# Patient Record
Sex: Female | Born: 1980 | Hispanic: No | Smoking: Never smoker
Health system: Southern US, Community
[De-identification: ages and names within clinical notes are randomized; demographics above are authoritative.]

## PROBLEM LIST (undated history)

## (undated) DIAGNOSIS — N3010 Interstitial cystitis (chronic) without hematuria: Secondary | ICD-10-CM

## (undated) DIAGNOSIS — I2699 Other pulmonary embolism without acute cor pulmonale: Secondary | ICD-10-CM

## (undated) DIAGNOSIS — D619 Aplastic anemia, unspecified: Secondary | ICD-10-CM

## (undated) DIAGNOSIS — D6851 Activated protein C resistance: Secondary | ICD-10-CM

## (undated) DIAGNOSIS — D509 Iron deficiency anemia, unspecified: Secondary | ICD-10-CM

## (undated) DIAGNOSIS — C569 Malignant neoplasm of unspecified ovary: Secondary | ICD-10-CM

## (undated) DIAGNOSIS — M797 Fibromyalgia: Secondary | ICD-10-CM

## (undated) DIAGNOSIS — C73 Malignant neoplasm of thyroid gland: Secondary | ICD-10-CM

## (undated) DIAGNOSIS — C55 Malignant neoplasm of uterus, part unspecified: Secondary | ICD-10-CM

## (undated) DIAGNOSIS — M1990 Unspecified osteoarthritis, unspecified site: Secondary | ICD-10-CM

## (undated) DIAGNOSIS — G8929 Other chronic pain: Secondary | ICD-10-CM

## (undated) DIAGNOSIS — M4850XA Collapsed vertebra, not elsewhere classified, site unspecified, initial encounter for fracture: Secondary | ICD-10-CM

## (undated) DIAGNOSIS — R569 Unspecified convulsions: Secondary | ICD-10-CM

## (undated) DIAGNOSIS — D6859 Other primary thrombophilia: Secondary | ICD-10-CM

## (undated) DIAGNOSIS — C50919 Malignant neoplasm of unspecified site of unspecified female breast: Secondary | ICD-10-CM

## (undated) HISTORY — PX: ABDOMINAL HYSTERECTOMY: SHX81

## (undated) HISTORY — PX: SINUSOTOMY: SHX291

## (undated) HISTORY — PX: THYROIDECTOMY: SHX17

## (undated) HISTORY — PX: GASTRIC BYPASS: SHX52

## (undated) HISTORY — PX: ABDOMINAL EXPLORATION SURGERY: SHX538

## (undated) HISTORY — PX: BREAST LUMPECTOMY: SHX2

## (undated) HISTORY — PX: COMBINED AUGMENTATION MAMMAPLASTY AND ABDOMINOPLASTY: SUR291

## (undated) HISTORY — PX: APPENDECTOMY: SHX54

## (undated) HISTORY — PX: CHOLECYSTECTOMY: SHX55

---

## 2009-10-03 ENCOUNTER — Emergency Department (HOSPITAL_COMMUNITY)
Admission: EM | Admit: 2009-10-03 | Discharge: 2009-10-03 | Payer: Self-pay | Source: Home / Self Care | Admitting: Emergency Medicine

## 2010-08-22 ENCOUNTER — Emergency Department (HOSPITAL_COMMUNITY)
Admission: EM | Admit: 2010-08-22 | Discharge: 2010-08-23 | Payer: Self-pay | Source: Home / Self Care | Admitting: Emergency Medicine

## 2010-08-22 LAB — DIFFERENTIAL
Basophils Relative: 0 % (ref 0–1)
Monocytes Absolute: 1.2 10*3/uL — ABNORMAL HIGH (ref 0.1–1.0)
Monocytes Relative: 11 % (ref 3–12)
Neutro Abs: 8 10*3/uL — ABNORMAL HIGH (ref 1.7–7.7)
Neutrophils Relative %: 71 % (ref 43–77)

## 2010-08-22 LAB — CBC
Hemoglobin: 10.5 g/dL — ABNORMAL LOW (ref 12.0–15.0)
Platelets: 293 10*3/uL (ref 150–400)
RBC: 3.57 MIL/uL — ABNORMAL LOW (ref 3.87–5.11)
RDW: 12.7 % (ref 11.5–15.5)

## 2010-08-22 LAB — BASIC METABOLIC PANEL
CO2: 25 mEq/L (ref 19–32)
Chloride: 105 mEq/L (ref 96–112)
GFR calc Af Amer: >60 mL/min (ref >60)
Glucose, Bld: 98 mg/dL (ref 70–99)
Potassium: 4 mEq/L (ref 3.5–5.1)
Sodium: 138 mEq/L (ref 135–145)

## 2010-08-25 ENCOUNTER — Emergency Department (HOSPITAL_COMMUNITY)
Admission: EM | Admit: 2010-08-25 | Discharge: 2010-08-25 | Payer: Self-pay | Source: Home / Self Care | Admitting: Emergency Medicine

## 2010-08-25 LAB — CBC
HCT: 30.5 % — ABNORMAL LOW (ref 36.0–46.0)
Hemoglobin: 10 g/dL — ABNORMAL LOW (ref 12.0–15.0)
MCH: 28.6 pg (ref 26.0–34.0)
MCHC: 32.8 g/dL (ref 30.0–36.0)
MCV: 87.1 fL (ref 78.0–100.0)

## 2010-08-25 LAB — POCT I-STAT, CHEM 8
BUN: 6 mg/dL (ref 6–23)
Chloride: 103 mEq/L (ref 96–112)
Creatinine, Ser: 0.7 mg/dL (ref 0.4–1.2)
HCT: 32 % — ABNORMAL LOW (ref 36.0–46.0)
Potassium: 3.6 mEq/L (ref 3.5–5.1)
Sodium: 135 mEq/L (ref 135–145)

## 2010-08-25 LAB — COMPREHENSIVE METABOLIC PANEL
AST: 107 U/L — ABNORMAL HIGH (ref 0–37)
Albumin: 2.7 g/dL — ABNORMAL LOW (ref 3.5–5.2)
BUN: 5 mg/dL — ABNORMAL LOW (ref 6–23)
Creatinine, Ser: 0.48 mg/dL (ref 0.4–1.2)
GFR calc non Af Amer: >60 mL/min (ref >60)
Potassium: 3.5 mEq/L (ref 3.5–5.1)
Sodium: 133 mEq/L — ABNORMAL LOW (ref 135–145)

## 2010-08-25 LAB — URINALYSIS, ROUTINE W REFLEX MICROSCOPIC
Ketones, ur: NEGATIVE mg/dL
Nitrite: NEGATIVE
Protein, ur: NEGATIVE mg/dL
Specific Gravity, Urine: 1.007 (ref 1.005–1.030)
Urobilinogen, UA: 0.2 mg/dL (ref 0.0–1.0)
pH: 7.5 (ref 5.0–8.0)

## 2010-08-25 LAB — DIFFERENTIAL
Basophils Absolute: 0 10*3/uL (ref 0.0–0.1)
Eosinophils Absolute: 0.1 10*3/uL (ref 0.0–0.7)
Monocytes Relative: 9 % (ref 3–12)
Neutro Abs: 9.4 10*3/uL — ABNORMAL HIGH (ref 1.7–7.7)
Neutrophils Relative %: 83 % — ABNORMAL HIGH (ref 43–77)

## 2010-08-25 LAB — URINE MICROSCOPIC-ADD ON

## 2010-10-19 LAB — DIFFERENTIAL
Basophils Absolute: 0 10*3/uL (ref 0.0–0.1)
Basophils Relative: 0 % (ref 0–1)
Eosinophils Absolute: 0.2 10*3/uL (ref 0.0–0.7)
Lymphs Abs: 1.3 10*3/uL (ref 0.7–4.0)
Monocytes Relative: 8 % (ref 3–12)

## 2010-10-19 LAB — POCT I-STAT, CHEM 8
BUN: 10 mg/dL (ref 6–23)
Chloride: 112 mEq/L (ref 96–112)
Creatinine, Ser: <0.2 mg/dL — ABNORMAL LOW (ref 0.4–1.2)
Hemoglobin: 11.9 g/dL — ABNORMAL LOW (ref 12.0–15.0)
Potassium: 3.7 mEq/L (ref 3.5–5.1)
TCO2: 14 mmol/L (ref 0–100)

## 2010-10-19 LAB — CBC
MCV: 87.4 fL (ref 78.0–100.0)
WBC: 4.6 10*3/uL (ref 4.0–10.5)

## 2010-10-19 LAB — CARBAMAZEPINE LEVEL, TOTAL: Carbamazepine Lvl: <2 ug/mL — ABNORMAL LOW (ref 4.0–12.0)

## 2012-09-19 ENCOUNTER — Encounter (HOSPITAL_BASED_OUTPATIENT_CLINIC_OR_DEPARTMENT_OTHER): Payer: Self-pay | Admitting: *Deleted

## 2012-09-19 ENCOUNTER — Emergency Department (HOSPITAL_BASED_OUTPATIENT_CLINIC_OR_DEPARTMENT_OTHER)
Admission: EM | Admit: 2012-09-19 | Discharge: 2012-09-19 | Disposition: A | Discharge status: Home / Self Care | Attending: Emergency Medicine | Admitting: Emergency Medicine

## 2012-09-19 ENCOUNTER — Emergency Department (HOSPITAL_BASED_OUTPATIENT_CLINIC_OR_DEPARTMENT_OTHER)

## 2012-09-19 DIAGNOSIS — Z8543 Personal history of malignant neoplasm of ovary: Secondary | ICD-10-CM | POA: Insufficient documentation

## 2012-09-19 DIAGNOSIS — R0989 Other specified symptoms and signs involving the circulatory and respiratory systems: Secondary | ICD-10-CM | POA: Insufficient documentation

## 2012-09-19 DIAGNOSIS — R209 Unspecified disturbances of skin sensation: Secondary | ICD-10-CM | POA: Insufficient documentation

## 2012-09-19 DIAGNOSIS — R0789 Other chest pain: Secondary | ICD-10-CM | POA: Insufficient documentation

## 2012-09-19 DIAGNOSIS — Z79899 Other long term (current) drug therapy: Secondary | ICD-10-CM | POA: Insufficient documentation

## 2012-09-19 DIAGNOSIS — G8929 Other chronic pain: Secondary | ICD-10-CM | POA: Insufficient documentation

## 2012-09-19 DIAGNOSIS — R609 Edema, unspecified: Secondary | ICD-10-CM | POA: Insufficient documentation

## 2012-09-19 DIAGNOSIS — Z862 Personal history of diseases of the blood and blood-forming organs and certain disorders involving the immune mechanism: Secondary | ICD-10-CM | POA: Insufficient documentation

## 2012-09-19 DIAGNOSIS — Z8542 Personal history of malignant neoplasm of other parts of uterus: Secondary | ICD-10-CM | POA: Insufficient documentation

## 2012-09-19 DIAGNOSIS — Z853 Personal history of malignant neoplasm of breast: Secondary | ICD-10-CM | POA: Insufficient documentation

## 2012-09-19 DIAGNOSIS — Z8739 Personal history of other diseases of the musculoskeletal system and connective tissue: Secondary | ICD-10-CM | POA: Insufficient documentation

## 2012-09-19 DIAGNOSIS — Z86711 Personal history of pulmonary embolism: Secondary | ICD-10-CM | POA: Insufficient documentation

## 2012-09-19 DIAGNOSIS — R079 Chest pain, unspecified: Secondary | ICD-10-CM

## 2012-09-19 DIAGNOSIS — Z85850 Personal history of malignant neoplasm of thyroid: Secondary | ICD-10-CM | POA: Insufficient documentation

## 2012-09-19 DIAGNOSIS — R42 Dizziness and giddiness: Secondary | ICD-10-CM | POA: Insufficient documentation

## 2012-09-19 DIAGNOSIS — Z8781 Personal history of (healed) traumatic fracture: Secondary | ICD-10-CM | POA: Insufficient documentation

## 2012-09-19 DIAGNOSIS — R0609 Other forms of dyspnea: Secondary | ICD-10-CM | POA: Insufficient documentation

## 2012-09-19 DIAGNOSIS — Z8742 Personal history of other diseases of the female genital tract: Secondary | ICD-10-CM | POA: Insufficient documentation

## 2012-09-19 HISTORY — DX: Aplastic anemia, unspecified: D61.9

## 2012-09-19 HISTORY — DX: Malignant neoplasm of thyroid gland: C73

## 2012-09-19 HISTORY — DX: Other pulmonary embolism without acute cor pulmonale: I26.99

## 2012-09-19 HISTORY — DX: Malignant neoplasm of uterus, part unspecified: C55

## 2012-09-19 HISTORY — DX: Fibromyalgia: M79.7

## 2012-09-19 HISTORY — DX: Other chronic pain: G89.29

## 2012-09-19 HISTORY — DX: Interstitial cystitis (chronic) without hematuria: N30.10

## 2012-09-19 HISTORY — DX: Unspecified osteoarthritis, unspecified site: M19.90

## 2012-09-19 HISTORY — DX: Collapsed vertebra, not elsewhere classified, site unspecified, initial encounter for fracture: M48.50XA

## 2012-09-19 HISTORY — DX: Malignant neoplasm of unspecified ovary: C56.9

## 2012-09-19 HISTORY — DX: Malignant neoplasm of unspecified site of unspecified female breast: C50.919

## 2012-09-19 LAB — BASIC METABOLIC PANEL
CO2: 26 mEq/L (ref 19–32)
Chloride: 104 mEq/L (ref 96–112)
GFR calc Af Amer: >90 mL/min (ref >90)
Potassium: 3.8 mEq/L (ref 3.5–5.1)
Sodium: 140 mEq/L (ref 135–145)

## 2012-09-19 LAB — CBC WITH DIFFERENTIAL/PLATELET
Basophils Absolute: 0 10*3/uL (ref 0.0–0.1)
Basophils Relative: 0 % (ref 0–1)
Lymphocytes Relative: 43 % (ref 12–46)
MCHC: 32.6 g/dL (ref 30.0–36.0)
Neutro Abs: 2 10*3/uL (ref 1.7–7.7)
Neutrophils Relative %: 41 % — ABNORMAL LOW (ref 43–77)
RDW: 13.1 % (ref 11.5–15.5)
WBC: 4.9 10*3/uL (ref 4.0–10.5)

## 2012-09-19 LAB — D-DIMER, QUANTITATIVE: D-Dimer, Quant: 0.4 ug/mL-FEU (ref 0.00–0.48)

## 2012-09-19 LAB — TROPONIN I: Troponin I: <0.3 ng/mL (ref <0.30)

## 2012-09-19 MED ORDER — CYCLOBENZAPRINE HCL 10 MG PO TABS: 10.0000 mg | ORAL_TABLET | Freq: Three times a day (TID) | ORAL | Status: DC | PRN

## 2012-09-19 NOTE — ED Notes (Signed)
Patient states she developed generalized chest pain 2 nights ago.  Describes pain as constant and sharp.  States this morning the pain is specific on the right chest and is associated with lightheadedness and tingling in her extremities.  States she has a history of PE;'s.

## 2012-09-19 NOTE — ED Provider Notes (Addendum)
History     CSN: [id number]  Arrival date & time 09/19/12  1478   None     Chief Complaint  Patient presents with  . Chest Pain    (Consider location/radiation/quality/duration/timing/severity/associated sxs/prior treatment) HPI Is a 32 year old female with a past history of multiple cancers as well as pulmonary emboli. She is here with a two-day history of chest pain. The pain began as a pressure across her entire chest 2 days ago but has subsequently evolved into a sharp, "ice pick" like pain to the right of her sternum. There is some mild dyspnea associated with it. The pain is not pleuritic. The pain is not similar to previous pulmonary emboli. The pain has waxed and waned and is somewhat episodic. There is no specific trigger for such episodes. She has had periodic lightheadedness and paresthesias of the fingers and toes. Yesterday she had some edema of her ankles and feet but this is resolved. She denies lower extremity pain cough, hemoptysis, fever, chills, nausea, vomiting or diarrhea.  Past Medical History  Diagnosis Date  . Anemia aplastic aregenerative   . Ovarian cancer   . Thyroid cancer   . Breast cancer   . Uterine cancer   . Pulmonary embolism   . Fibromyalgia   . Spinal compression fracture   . IC (interstitial cystitis)   . Chronic pain   . Arthritis     Past Surgical History  Procedure Laterality Date  . Abdominal hysterectomy    . Thyroidectomy    . Appendectomy    . Cholecystectomy    . Gastric bypass    . Breast lumpectomy    . Combined augmentation mammaplasty and abdominoplasty    . Sinusotomy    . Abdominal exploration surgery      No family history on file.  History  Substance Use Topics  . Smoking status: Never Smoker   . Smokeless tobacco: Not on file  . Alcohol Use: Yes     Comment: occassionally    OB History   Grav Para Term Preterm Abortions TAB SAB Ect Mult Living                  Review of Systems  All other systems  reviewed and are negative.    Allergies  Ivp dye  Home Medications   Current Outpatient Rx  Name  Route  Sig  Dispense  Refill  . darifenacin (ENABLEX) 15 MG 24 hr tablet   Oral   Take 15 mg by mouth daily.         . Diclofenac-Misoprostol (ARTHROTEC PO)   Oral   Take by mouth.         . gabapentin (NEURONTIN) 400 MG capsule   Oral   Take 400 mg by mouth 3 (three) times daily.         Marland Kitchen ibuprofen (ADVIL,MOTRIN) 800 MG tablet   Oral   Take 800 mg by mouth every 8 (eight) hours as needed for pain.         Marland Kitchen levothyroxine (SYNTHROID, LEVOTHROID) 100 MCG tablet   Oral   Take 100 mcg by mouth daily.         . Multiple Vitamin (MULTIVITAMIN) capsule   Injection   Inject 1 capsule as directed daily.         Marland Kitchen oxymorphone (OPANA ER) 20 MG 12 hr tablet   Oral   Take 20 mg by mouth every 12 (twelve) hours.         Marland Kitchen  pantoprazole (PROTONIX) 40 MG tablet   Oral   Take 40 mg by mouth daily.         . Vitamin B1-B12 5.5-0.0125 MG/5ML SOLN   Oral   Take by mouth.         . Vitamin D, Ergocalciferol, (DRISDOL) 50000 UNITS CAPS   Oral   Take 50,000 Units by mouth.           BP 131/48  Pulse 86  Temp(Src) 98.2 F (36.8 C) (Oral)  Resp 20  SpO2 100%  Physical Exam General: Well-developed, well-nourished female in no acute distress; appearance consistent with age of record HENT: normocephalic, atraumatic Eyes: pupils equal round and reactive to light; extraocular muscles intact Neck: supple Heart: regular rate and rhythm; no murmurs, rubs or gallops Lungs: clear to auscultation bilaterally Chest: Nontender Abdomen: soft; nondistended; nontender; bowel sounds present Extremities: No deformity; full range of motion; pulses normal; no edema Neurologic: Awake, alert and oriented; motor function intact in all extremities and symmetric; no facial droop Skin: Warm and dry Psychiatric: Normal mood and affect    ED Course  Procedures (including  critical care time)     MDM   Nursing notes and vitals signs, including pulse oximetry, reviewed.  Summary of this visit's results, reviewed by myself:  Labs:  Results for orders placed during the hospital encounter of 09/19/12 (from the past 24 hour(s))  CBC WITH DIFFERENTIAL     Status: Abnormal   Collection Time    09/19/12 11:30 AM      Result Value Range   WBC 4.9  4.0 - 10.5 K/uL   RBC 4.45  3.87 - 5.11 MIL/uL   Hemoglobin 12.6  12.0 - 15.0 g/dL   HCT 91.4  78.2 - 95.6 %   MCV 87.0  78.0 - 100.0 fL   MCH 28.3  26.0 - 34.0 pg   MCHC 32.6  30.0 - 36.0 g/dL   RDW 21.3  08.6 - 57.8 %   Platelets 259  150 - 400 K/uL   Neutrophils Relative 41 (*) 43 - 77 %   Neutro Abs 2.0  1.7 - 7.7 K/uL   Lymphocytes Relative 43  12 - 46 %   Lymphs Abs 2.1  0.7 - 4.0 K/uL   Monocytes Relative 11  3 - 12 %   Monocytes Absolute 0.5  0.1 - 1.0 K/uL   Eosinophils Relative 6 (*) 0 - 5 %   Eosinophils Absolute 0.3  0.0 - 0.7 K/uL   Basophils Relative 0  0 - 1 %   Basophils Absolute 0.0  0.0 - 0.1 K/uL  BASIC METABOLIC PANEL     Status: None   Collection Time    09/19/12 11:30 AM      Result Value Range   Sodium 140  135 - 145 mEq/L   Potassium 3.8  3.5 - 5.1 mEq/L   Chloride 104  96 - 112 mEq/L   CO2 26  19 - 32 mEq/L   Glucose, Bld 92  70 - 99 mg/dL   BUN 13  6 - 23 mg/dL   Creatinine, Ser 4.69  0.50 - 1.10 mg/dL   Calcium 9.1  8.4 - 62.9 mg/dL   GFR calc non Af Amer >90  >90 mL/min   GFR calc Af Amer >90  >90 mL/min  TROPONIN I     Status: None   Collection Time    09/19/12 11:30 AM      Result Value Range  Troponin I <0.30  <0.30 ng/mL  D-DIMER, QUANTITATIVE     Status: None   Collection Time    09/19/12 11:30 AM      Result Value Range   D-Dimer, Quant 0.40  0.00 - 0.48 ug/mL-FEU    Imaging Studies: Dg Chest 2 View  09/19/2012  *RADIOLOGY REPORT*  Clinical Data: Chest pain  CHEST - 2 VIEW  Comparison: August 25, 2010  Findings: The lungs are clear.  The heart size and  pulmonary vascularity normal.  No adenopathy.  No bone lesions.  No pneumothorax.  IMPRESSION: No abnormality noted.   Original Report Authenticated By: Bretta Bang, M.D.        Date: 09/19/2012 10:07 AM  Rate: 86  Rhythm: normal sinus rhythm  QRS Axis: normal  Intervals: normal  ST/T Wave abnormalities: normal  Conduction Disutrbances: none  Narrative Interpretation: unremarkable  Comparison with previous EKG: Rate is slower, incomplete right bundle-branch block no longer evident  1:19 PM Patient's d-dimer is within normal limits. The pain is not pleuritic nor is she hypoxic or tachycardic. At this time pulmonary embolism appears unlikely. 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 The patient was advised should symptoms worsen or change we will consider performing a VQ scan.         Hanley Seamen, MD 09/19/12 1320  Hanley Seamen, MD 09/19/12 1324

## 2013-07-29 ENCOUNTER — Emergency Department (HOSPITAL_COMMUNITY)
Admission: EM | Admit: 2013-07-29 | Discharge: 2013-07-29 | Disposition: A | Discharge status: Home / Self Care | Payer: Medicaid Other | Attending: Emergency Medicine | Admitting: Emergency Medicine

## 2013-07-29 ENCOUNTER — Encounter (HOSPITAL_COMMUNITY): Payer: Self-pay | Admitting: Emergency Medicine

## 2013-07-29 ENCOUNTER — Emergency Department (HOSPITAL_COMMUNITY): Payer: Medicaid Other

## 2013-07-29 DIAGNOSIS — Z8541 Personal history of malignant neoplasm of cervix uteri: Secondary | ICD-10-CM | POA: Insufficient documentation

## 2013-07-29 DIAGNOSIS — G8929 Other chronic pain: Secondary | ICD-10-CM | POA: Insufficient documentation

## 2013-07-29 DIAGNOSIS — IMO0001 Reserved for inherently not codable concepts without codable children: Secondary | ICD-10-CM | POA: Insufficient documentation

## 2013-07-29 DIAGNOSIS — Z853 Personal history of malignant neoplasm of breast: Secondary | ICD-10-CM | POA: Insufficient documentation

## 2013-07-29 DIAGNOSIS — M546 Pain in thoracic spine: Secondary | ICD-10-CM | POA: Insufficient documentation

## 2013-07-29 DIAGNOSIS — M549 Dorsalgia, unspecified: Secondary | ICD-10-CM

## 2013-07-29 DIAGNOSIS — Z888 Allergy status to other drugs, medicaments and biological substances status: Secondary | ICD-10-CM | POA: Insufficient documentation

## 2013-07-29 DIAGNOSIS — Z79899 Other long term (current) drug therapy: Secondary | ICD-10-CM | POA: Insufficient documentation

## 2013-07-29 DIAGNOSIS — Z8543 Personal history of malignant neoplasm of ovary: Secondary | ICD-10-CM | POA: Insufficient documentation

## 2013-07-29 DIAGNOSIS — Z86711 Personal history of pulmonary embolism: Secondary | ICD-10-CM | POA: Insufficient documentation

## 2013-07-29 DIAGNOSIS — Y929 Unspecified place or not applicable: Secondary | ICD-10-CM | POA: Insufficient documentation

## 2013-07-29 DIAGNOSIS — D619 Aplastic anemia, unspecified: Secondary | ICD-10-CM | POA: Insufficient documentation

## 2013-07-29 DIAGNOSIS — M545 Low back pain, unspecified: Secondary | ICD-10-CM | POA: Insufficient documentation

## 2013-07-29 DIAGNOSIS — M129 Arthropathy, unspecified: Secondary | ICD-10-CM | POA: Insufficient documentation

## 2013-07-29 DIAGNOSIS — W108XXA Fall (on) (from) other stairs and steps, initial encounter: Secondary | ICD-10-CM | POA: Insufficient documentation

## 2013-07-29 DIAGNOSIS — Z87440 Personal history of urinary (tract) infections: Secondary | ICD-10-CM | POA: Insufficient documentation

## 2013-07-29 DIAGNOSIS — Z85850 Personal history of malignant neoplasm of thyroid: Secondary | ICD-10-CM | POA: Insufficient documentation

## 2013-07-29 DIAGNOSIS — Y9301 Activity, walking, marching and hiking: Secondary | ICD-10-CM | POA: Insufficient documentation

## 2013-07-29 DIAGNOSIS — R209 Unspecified disturbances of skin sensation: Secondary | ICD-10-CM | POA: Insufficient documentation

## 2013-07-29 DIAGNOSIS — Z8739 Personal history of other diseases of the musculoskeletal system and connective tissue: Secondary | ICD-10-CM | POA: Insufficient documentation

## 2013-07-29 MED ORDER — METHOCARBAMOL 500 MG PO TABS
500.0000 mg | ORAL_TABLET | Freq: Once | ORAL | Status: AC
  Administered 2013-07-29: 500 mg via ORAL
  Filled 2013-07-29: qty 1

## 2013-07-29 MED ORDER — PANTOPRAZOLE SODIUM 40 MG PO TBEC
40.0000 mg | DELAYED_RELEASE_TABLET | Freq: Once | ORAL | Status: AC
  Administered 2013-07-29: 40 mg via ORAL
  Filled 2013-07-29: qty 1

## 2013-07-29 MED ORDER — OXYCODONE-ACETAMINOPHEN 5-325 MG PO TABS
2.0000 | ORAL_TABLET | Freq: Once | ORAL | Status: AC
  Administered 2013-07-29: 2 via ORAL
  Filled 2013-07-29: qty 2

## 2013-07-29 MED ORDER — PANTOPRAZOLE SODIUM 20 MG PO TBEC: 20.0000 mg | DELAYED_RELEASE_TABLET | Freq: Every day | ORAL | Status: DC

## 2013-07-29 MED ORDER — METHOCARBAMOL 500 MG PO TABS: 500.0000 mg | ORAL_TABLET | Freq: Two times a day (BID) | ORAL | Status: DC

## 2013-07-29 MED ORDER — OXYCODONE-ACETAMINOPHEN 5-325 MG PO TABS: 1.0000 | ORAL_TABLET | Freq: Four times a day (QID) | ORAL | Status: DC | PRN

## 2013-07-29 NOTE — ED Notes (Signed)
Signature pad not working. 

## 2013-07-29 NOTE — ED Notes (Signed)
Pt reports she was having back pain yesterday and she had a friend help her pop her back. When they did she felt a "pop" in her back and pain became worse. States shes had severe pain since especially when walking or turning her neck. Her pain is in her neck and lower back. Ambulatory, mae

## 2013-07-29 NOTE — Discharge Instructions (Signed)
°Emergency Department Resource Guide °1) Find a Doctor and Pay Out of Pocket °Although you won't have to find out who is covered by your insurance plan, it is a good idea to ask around and get recommendations. You will then need to call the office and see if the doctor you have chosen will accept you as a new patient and what types of options they offer for patients who are self-pay. Some doctors offer discounts or will set up payment plans for their patients who do not have insurance, but you will need to ask so you aren't surprised when you get to your appointment. ° °2) Contact Your Local Health Department °Not all health departments have doctors that can see patients for sick visits, but many do, so it is worth a call to see if yours does. If you don't know where your local health department is, you can check in your phone book. The CDC also has a tool to help you locate your state's health department, and many state websites also have listings of all of their local health departments. ° °3) Find a Walk-in Clinic °If your illness is not likely to be very severe or complicated, you may want to try a walk in clinic. These are popping up all over the country in pharmacies, drugstores, and shopping centers. They're usually staffed by nurse practitioners or physician assistants that have been trained to treat common illnesses and complaints. They're usually fairly quick and inexpensive. However, if you have serious medical issues or chronic medical problems, these are probably not your best option. ° °No Primary Care Doctor: °- Call [facility] at  832-8000 - they can help you locate a primary care doctor that  accepts your insurance, provides certain services, etc. °- Physician Referral Service- 1-800-533-3463 ° °Chronic Pain Problems: °Organization         Address  Phone   Notes  °[facility]  (336) [phone] Patients need to be referred by their primary care doctor.  ° °Medication  Assistance: °Organization         Address  Phone   Notes  °[place] [facility] [address] °[place], [place] [address] (336) 641-8030 --Must be a resident of [place] °-- Must have NO insurance coverage whatsoever (no Medicaid/ Medicare, etc.) °-- The pt. MUST have a primary care doctor that directs their care regularly and follows them in the community °  °[facility]  (866) 331-1348   °[employer]  (888) 892-1162   ° °Agencies that provide inexpensive medical care: °Organization         Address  Phone   Notes  °[facility]  (336) 832-8035   °[facility]    (336) 832-7272   °[facility] [address] °[place], [place] [address] (336) 832-4777   °[facility] [address], °[place] (336) 271-4999   °[facility]    (336) 373-0678   °[facility]    (336) 272-1050   °[facility] ° [address], [place] Phone:  (336) 832-4444, Fax:  (336) 832-4440 Hours of Operation:  9 am - 6 pm, M-F.  Also accepts Medicaid/Medicare and self-pay.  ° [facility] ° [address], [place] Phone: (336) [phone], Fax: (336) [phone]. Hours of Operation:  8:30 am - 5:30 pm, M-F.  Also accepts Medicaid and self-pay.  °[facility] [place] [address], [place] Phone: (336) 878-6027   °[facility] [address], [place], [place] (336)723-1848, Ext. 123 Mondays & Thursdays: 7-9 AM.  First 15 patients are seen on a first come, first serve basis. °  ° °Medicaid-accepting [place] Providers: ° °Organization         Address  Phone   Notes  °[facility] [address], [place] (336) 641-2100 Also accepts self-pay patients.  °[facility] [address], [place] ° (336) 856-9996   °[facility] [address], [place]  (336) 288-8857   °[facility] [address] [place] [address], [place] (336) 299-7000   °Veita Bland [address], [place]  ° (336) 373-1557 Only accepts [place] Access Medicaid patients after they have their name applied to their card.  ° °Self-Pay (no insurance) in [place]: ° °Organization         Address  Phone   Notes  °Sickle Cell Patients, [facility] [address], [place] (336) 832-1970   °[facility] [address], [place] (336) 832-4400   °[facility] ° [address], [place] (336) 992-4800   °[facility]/Dr. Osei-Bonsu ° [address] [place] [address], [place] or [address], [place] (336) [phone] Phone number for both [place] and [place] locations is the same.  °[facility] [address], [place] (336) 299-0000   °[facility] [address] [place] [address], [place] or [address] (336) 852-7530 °(336) 878-2260   °[facility] [address], [place] (336) 350-1642, phone; (336) 294-5005, fax Sees patients 1st and 3rd Saturday of every month.  Must not qualify for public or private insurance (i.e. Medicaid, Medicare, [place] Health Choice, Veterans' Benefits) • Household income should be no more than 200% of the poverty level •The clinic cannot treat you if you are pregnant or think you are pregnant • Sexually transmitted diseases are not treated at the clinic.  ° ° °Dental Care: °Organization         Address  Phone  Notes  °[place] [facility] [address], [place] (336) 641-6152 Accepts children up to age 21 who are enrolled in Medicaid or [place] Health Choice; pregnant women with a Medicaid card; and children who have applied for Medicaid or [place] Health Choice, but were declined, whose parents can pay a reduced fee at time of service.  °[place]  [facility] [place]  [address], [place] (336) 641-7733 Accepts children up to age 21 who are enrolled in Medicaid or [place] Health Choice; pregnant women with a Medicaid card; and children who have applied for Medicaid or [place] Health Choice, but were declined, whose parents can pay a reduced fee at time of service.  °[facility] ° [address],  (336) 641-4533 Patients are seen by appointment only. Walk-ins are not accepted. [facility] will see patients 18 years of age and older. °Monday - Tuesday (8am-5pm) °Most Wednesdays (8:30-5pm) °$30 per visit, cash only  °[facility] ° [address], [place] (336) 641-4533 Patients are seen by appointment only. Walk-ins are not accepted. [facility] will see patients 18 years of age and older. °One   Wednesday Evening (Monthly: Volunteer Based).  $30 per visit, cash only  °[facility]  (919) 537-3737 for adults; Children under age 4, call Graduate Pediatric Dentistry at ([phone]) [phone]. Children aged 4-14, please call (919) [phone] to request a pediatric application. ° Dental services are provided in all areas of dental care including fillings, crowns and bridges, complete and partial dentures, implants, gum treatment, root canals, and extractions. Preventive care is also provided. Treatment is provided to both adults and children. °Patients are selected via a lottery and there is often a waiting list. °  °[facility] [address], °[place] ° (336) 763-8833 [email or web address] °  °[facility] [address], [place], [place] (336)723-1848, Ext. 123 Second and Fourth Thursday of each month, opens at 6:30 AM; Clinic ends at 9 AM.  Patients are seen on a first-come first-served basis, and a limited number are seen during each clinic.  ° °[facility] ° [address], [place], [place] (336) 723-7904    Eligibility Requirements °You must have lived in [place], [place], or [place] counties for at least the last three months. °  You cannot be eligible for state or federal sponsored healthcare insurance, including Veterans Administration, Medicaid, or Medicare. °  You generally cannot be eligible for healthcare insurance through your employer.  °  How to apply: °Eligibility screenings are held every Tuesday and Wednesday afternoon from 1:00 pm until 4:00 pm. You do not need an appointment for the interview!  °[facility] [address], [place], [place] 336-631-2330   °[facility]  336-342-8273   °[facility]  336-703-3100   °[facility]  336-570-6415   ° °Behavioral Health Resources in the Community: °Intensive Outpatient Programs °Organization         Address  Phone  Notes  °[facility] [address], [place], [place] 336-878-6098   °[facility] [address], [place], [place] 336-832-9800   °[facility] [address], [place], [place] ° 336-882-2125   °[facility] [address],  °[place], [place] 1-800-853-5163 or 336-641-4981   °Substance Abuse Resources °Organization         Address  Phone  Notes  °[facility]  336-882-2125   °[facility]  336-784-9470   °[facility]  336-285-9073   °[facility]  336-845-3988   °Residential & Outpatient Substance Abuse Program  1-800-659-3381   °Psychological Services °Organization         Address  Phone  Notes  °[facility]  336- 832-9600   °[facility]  336- 378-7881   °[facility] [address], [place] 1-800-853-5163 or 336-641-4981   ° °Mobile Crisis Teams °Organization         Address  Phone  Notes  °[facility], Mobile Crisis Care Unit  1-877-626-1772   °Assertive °[facility] ° [address]  [place], [place] 336-834-9664   °Sharon DeEsch [address] °[place] 336-554-5454   ° °Self-Help/Support Groups °Organization         Address  Phone             Notes  °[facility]  - variety of support groups  336- 373-1402 Call for more information  °Narcotics Anonymous (NA), [facility] [address], °[place]   2 meetings at this location  ° °  Residential Treatment Programs °Organization         Address  Phone  Notes  °[facility] [address],    °[place]  1-866-801-8205   °[facility] ° [address], [place], [place] 704-293-8524   °[facility] [address], [place] 336-845-3988 Admissions: 8am-3pm M-F  °[facility] [address]    °[place], [place] 336-841-1104   °[facility] [address], [place], [place] 336-379-7146   °[facility] [address]  °[place], [place] 336-285-9073   °[facility] - Intensive Outpatient [address], [place], [place] 336-852-3033   °[facility] [address]  °[place], [place] 1-877-615-2722 or 336-784-9470   °[facility] [address] [place], [place] 336-227-7417 Accepts Medicaid  °[facility] [address]  ° [place] 1-800-659-3381 Substance Abuse/Addiction Treatment  ° °[place] Behavioral Health Resources °Organization         Address  Phone  Notes  °[facility]  (888) 581-9988   °Julie Brannon, PhD [address] [place], [place]   (336) 349-5553 or (336) 951-0000   °[facility]   [address] °[place], [place] (336) 349-4454   °[facility] [address], [place], [place] (336) 342-8316 Insurance/Medicaid/sponsorship through [facility]  °[facility] [address]                                    [place], [place] (336) 342-8316 Therapy/tele-psych/case    °[facility] [address]  ° [place],  (336) 349-2233    °Dr. Arfeen  (336) 349-4544   °[facility] [place]  [employer] [place] [facility] 1) [address],  °2) [address], [place] °3)  [address], [place] (336) 349-3220 °(336) 342-7768 ° °(336) 342-8140   °[place] Child Abuse [facility] (336) 342-1394 or (336) 342-3537 (After Hours)    ° ° °
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
## 2013-07-29 NOTE — ED Notes (Signed)
Patient transported to X-ray 

## 2013-07-30 NOTE — ED Provider Notes (Signed)
Medical screening examination/treatment/procedure(s) were performed by non-physician practitioner and as supervising physician I was immediately available for consultation/collaboration.  EKG Interpretation   None         Rafiq Bucklin, MD 07/30/13 1347 

## 2013-12-28 ENCOUNTER — Encounter: Payer: Self-pay | Admitting: Physical Medicine & Rehabilitation

## 2014-02-08 ENCOUNTER — Ambulatory Visit: Admitting: Physical Medicine & Rehabilitation

## 2014-02-14 ENCOUNTER — Encounter: Payer: Self-pay | Admitting: Physical Medicine & Rehabilitation

## 2014-02-14 ENCOUNTER — Ambulatory Visit (HOSPITAL_BASED_OUTPATIENT_CLINIC_OR_DEPARTMENT_OTHER): Payer: Medicaid Other | Admitting: Physical Medicine & Rehabilitation

## 2014-02-14 ENCOUNTER — Encounter: Attending: Physical Medicine & Rehabilitation

## 2014-02-14 VITALS — BP 142/78 | HR 99 | Resp 14 | Ht 66.0 in | Wt 162.0 lb

## 2014-02-14 DIAGNOSIS — G500 Trigeminal neuralgia: Secondary | ICD-10-CM | POA: Insufficient documentation

## 2014-02-14 DIAGNOSIS — M549 Dorsalgia, unspecified: Secondary | ICD-10-CM

## 2014-02-14 DIAGNOSIS — M542 Cervicalgia: Secondary | ICD-10-CM | POA: Insufficient documentation

## 2014-02-14 DIAGNOSIS — Z9884 Bariatric surgery status: Secondary | ICD-10-CM | POA: Diagnosis not present

## 2014-02-14 DIAGNOSIS — Z853 Personal history of malignant neoplasm of breast: Secondary | ICD-10-CM | POA: Diagnosis not present

## 2014-02-14 DIAGNOSIS — G8921 Chronic pain due to trauma: Secondary | ICD-10-CM | POA: Insufficient documentation

## 2014-02-14 DIAGNOSIS — Z5181 Encounter for therapeutic drug level monitoring: Secondary | ICD-10-CM

## 2014-02-14 DIAGNOSIS — Z8543 Personal history of malignant neoplasm of ovary: Secondary | ICD-10-CM | POA: Diagnosis not present

## 2014-02-14 DIAGNOSIS — Z86711 Personal history of pulmonary embolism: Secondary | ICD-10-CM | POA: Diagnosis not present

## 2014-02-14 DIAGNOSIS — IMO0001 Reserved for inherently not codable concepts without codable children: Secondary | ICD-10-CM | POA: Diagnosis not present

## 2014-02-14 DIAGNOSIS — G8929 Other chronic pain: Secondary | ICD-10-CM | POA: Insufficient documentation

## 2014-02-14 DIAGNOSIS — M797 Fibromyalgia: Secondary | ICD-10-CM

## 2014-02-14 DIAGNOSIS — Z79899 Other long term (current) drug therapy: Secondary | ICD-10-CM

## 2014-02-14 DIAGNOSIS — R209 Unspecified disturbances of skin sensation: Secondary | ICD-10-CM | POA: Insufficient documentation

## 2014-02-14 MED ORDER — GABAPENTIN 600 MG PO TABS: 600.0000 mg | ORAL_TABLET | Freq: Three times a day (TID) | ORAL | Status: DC

## 2014-02-14 MED ORDER — TRAMADOL HCL 50 MG PO TABS: 50.0000 mg | ORAL_TABLET | Freq: Four times a day (QID) | ORAL | Status: DC | PRN

## 2014-02-14 NOTE — Patient Instructions (Signed)
Continue exercising  Acupuncture [facility] in [place]
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
## 2014-02-28 ENCOUNTER — Telehealth: Payer: Self-pay | Admitting: *Deleted

## 2014-02-28 NOTE — Telephone Encounter (Signed)
Attempted to contact patient for the 2nd time. No answer.

## 2014-02-28 NOTE — Telephone Encounter (Signed)
Tramadol is not working and needs something else.  I attempted to reach Ms Eriksson. I left her a message to call the office back.  (Dr Letta Pate is out of office and her UDS was positive for marijuana so he will not prescribe stronger medication based on this result)

## 2014-02-28 NOTE — Telephone Encounter (Signed)
Patient returned call to clinic

## 2014-03-01 NOTE — Telephone Encounter (Signed)
Contacted patient to inform her based on her UDS being positive for marijuana Dr. Letta Pate will not prescribe a stronger medication than Tramadol. Patient verbalized understanding.

## 2014-03-28 ENCOUNTER — Encounter: Attending: Physical Medicine & Rehabilitation

## 2014-03-28 ENCOUNTER — Ambulatory Visit: Admitting: Physical Medicine & Rehabilitation

## 2014-03-28 DIAGNOSIS — G500 Trigeminal neuralgia: Secondary | ICD-10-CM | POA: Insufficient documentation

## 2014-03-28 DIAGNOSIS — G8921 Chronic pain due to trauma: Secondary | ICD-10-CM | POA: Insufficient documentation

## 2014-03-28 DIAGNOSIS — M549 Dorsalgia, unspecified: Secondary | ICD-10-CM | POA: Insufficient documentation

## 2014-03-28 DIAGNOSIS — Z853 Personal history of malignant neoplasm of breast: Secondary | ICD-10-CM | POA: Insufficient documentation

## 2014-03-28 DIAGNOSIS — M542 Cervicalgia: Secondary | ICD-10-CM | POA: Insufficient documentation

## 2014-03-28 DIAGNOSIS — IMO0001 Reserved for inherently not codable concepts without codable children: Secondary | ICD-10-CM | POA: Insufficient documentation

## 2014-03-28 DIAGNOSIS — R209 Unspecified disturbances of skin sensation: Secondary | ICD-10-CM | POA: Insufficient documentation

## 2014-03-28 DIAGNOSIS — Z9884 Bariatric surgery status: Secondary | ICD-10-CM | POA: Insufficient documentation

## 2014-03-28 DIAGNOSIS — Z86711 Personal history of pulmonary embolism: Secondary | ICD-10-CM | POA: Insufficient documentation

## 2014-03-28 DIAGNOSIS — Z8543 Personal history of malignant neoplasm of ovary: Secondary | ICD-10-CM | POA: Insufficient documentation

## 2014-05-09 ENCOUNTER — Other Ambulatory Visit: Payer: Self-pay | Admitting: Physical Medicine & Rehabilitation

## 2014-05-10 ENCOUNTER — Other Ambulatory Visit: Payer: Self-pay | Admitting: Physical Medicine & Rehabilitation

## 2014-05-10 NOTE — Telephone Encounter (Signed)
There is a request for this tramadol but no return appt.  I refilled the gabapentin one time with note that no further refills without appt.  Do you want to refill the tramadol?  She did not return in 6 weeks after last appt and has no appt scheduled.

## 2014-05-10 NOTE — Telephone Encounter (Signed)
A. refill tramadol 1 by mouth twice a day #60, with 3 refills needs appt in Jan

## 2014-05-10 NOTE — Telephone Encounter (Signed)
Called to pharmacy and noted on call to pharmacist about return appt needed.

## 2015-01-18 ENCOUNTER — Encounter (HOSPITAL_COMMUNITY)
Admission: EM | Disposition: A | Discharge status: Home / Self Care | Payer: Self-pay | Source: Home / Self Care | Attending: Emergency Medicine

## 2015-01-18 ENCOUNTER — Emergency Department (HOSPITAL_COMMUNITY): Payer: Medicaid Other | Admitting: Anesthesiology

## 2015-01-18 ENCOUNTER — Other Ambulatory Visit: Payer: Self-pay | Admitting: Physical Medicine & Rehabilitation

## 2015-01-18 ENCOUNTER — Encounter (HOSPITAL_BASED_OUTPATIENT_CLINIC_OR_DEPARTMENT_OTHER): Payer: Self-pay | Admitting: Emergency Medicine

## 2015-01-18 ENCOUNTER — Emergency Department (HOSPITAL_BASED_OUTPATIENT_CLINIC_OR_DEPARTMENT_OTHER): Payer: Medicaid Other

## 2015-01-18 ENCOUNTER — Ambulatory Visit (HOSPITAL_BASED_OUTPATIENT_CLINIC_OR_DEPARTMENT_OTHER)
Admission: EM | Admit: 2015-01-18 | Discharge: 2015-01-19 | Disposition: A | Discharge status: Home / Self Care | Payer: Medicaid Other | Attending: Emergency Medicine | Admitting: Emergency Medicine

## 2015-01-18 DIAGNOSIS — Z91041 Radiographic dye allergy status: Secondary | ICD-10-CM | POA: Insufficient documentation

## 2015-01-18 DIAGNOSIS — Z888 Allergy status to other drugs, medicaments and biological substances status: Secondary | ICD-10-CM | POA: Insufficient documentation

## 2015-01-18 DIAGNOSIS — Z86711 Personal history of pulmonary embolism: Secondary | ICD-10-CM | POA: Insufficient documentation

## 2015-01-18 DIAGNOSIS — D6109 Other constitutional aplastic anemia: Secondary | ICD-10-CM | POA: Insufficient documentation

## 2015-01-18 DIAGNOSIS — Z8543 Personal history of malignant neoplasm of ovary: Secondary | ICD-10-CM | POA: Diagnosis not present

## 2015-01-18 DIAGNOSIS — G8918 Other acute postprocedural pain: Secondary | ICD-10-CM | POA: Insufficient documentation

## 2015-01-18 DIAGNOSIS — Z8542 Personal history of malignant neoplasm of other parts of uterus: Secondary | ICD-10-CM | POA: Insufficient documentation

## 2015-01-18 DIAGNOSIS — W312XXA Contact with powered woodworking and forming machines, initial encounter: Secondary | ICD-10-CM | POA: Diagnosis not present

## 2015-01-18 DIAGNOSIS — Z853 Personal history of malignant neoplasm of breast: Secondary | ICD-10-CM | POA: Diagnosis not present

## 2015-01-18 DIAGNOSIS — G8929 Other chronic pain: Secondary | ICD-10-CM | POA: Insufficient documentation

## 2015-01-18 DIAGNOSIS — S61313A Laceration without foreign body of left middle finger with damage to nail, initial encounter: Secondary | ICD-10-CM | POA: Diagnosis not present

## 2015-01-18 DIAGNOSIS — M797 Fibromyalgia: Secondary | ICD-10-CM | POA: Insufficient documentation

## 2015-01-18 DIAGNOSIS — S61219A Laceration without foreign body of unspecified finger without damage to nail, initial encounter: Secondary | ICD-10-CM

## 2015-01-18 DIAGNOSIS — Z811 Family history of alcohol abuse and dependence: Secondary | ICD-10-CM | POA: Diagnosis not present

## 2015-01-18 DIAGNOSIS — S62633B Displaced fracture of distal phalanx of left middle finger, initial encounter for open fracture: Secondary | ICD-10-CM | POA: Insufficient documentation

## 2015-01-18 DIAGNOSIS — S62639B Displaced fracture of distal phalanx of unspecified finger, initial encounter for open fracture: Secondary | ICD-10-CM

## 2015-01-18 DIAGNOSIS — Z91013 Allergy to seafood: Secondary | ICD-10-CM | POA: Insufficient documentation

## 2015-01-18 DIAGNOSIS — Z85850 Personal history of malignant neoplasm of thyroid: Secondary | ICD-10-CM | POA: Insufficient documentation

## 2015-01-18 HISTORY — PX: I & D EXTREMITY: SHX5045

## 2015-01-18 HISTORY — DX: Unspecified convulsions: R56.9

## 2015-01-18 SURGERY — IRRIGATION AND DEBRIDEMENT EXTREMITY
Anesthesia: General | Site: Finger | Laterality: Left

## 2015-01-18 MED ORDER — MIDAZOLAM HCL 2 MG/2ML IJ SOLN
INTRAMUSCULAR | Status: AC
  Filled 2015-01-18: qty 2

## 2015-01-18 MED ORDER — HYDROMORPHONE HCL 1 MG/ML IJ SOLN: 0.2500 mg | INTRAMUSCULAR | Status: DC | PRN

## 2015-01-18 MED ORDER — FENTANYL CITRATE (PF) 250 MCG/5ML IJ SOLN
INTRAMUSCULAR | Status: DC | PRN
  Administered 2015-01-18 (×2): 50 ug via INTRAVENOUS

## 2015-01-18 MED ORDER — ONDANSETRON HCL 4 MG/2ML IJ SOLN
INTRAMUSCULAR | Status: DC | PRN
  Administered 2015-01-18: 4 mg via INTRAVENOUS

## 2015-01-18 MED ORDER — FENTANYL CITRATE (PF) 250 MCG/5ML IJ SOLN
INTRAMUSCULAR | Status: AC
  Filled 2015-01-18: qty 5

## 2015-01-18 MED ORDER — SULFAMETHOXAZOLE-TRIMETHOPRIM 800-160 MG PO TABS: 1.0000 | ORAL_TABLET | Freq: Two times a day (BID) | ORAL | Status: DC

## 2015-01-18 MED ORDER — PROPOFOL 10 MG/ML IV BOLUS
INTRAVENOUS | Status: AC
  Filled 2015-01-18: qty 20

## 2015-01-18 MED ORDER — CEFAZOLIN SODIUM-DEXTROSE 2-3 GM-% IV SOLR
INTRAVENOUS | Status: DC | PRN
  Administered 2015-01-18: 2 g via INTRAVENOUS

## 2015-01-18 MED ORDER — SODIUM CHLORIDE 0.9 % IR SOLN
Status: DC | PRN
  Administered 2015-01-18: 1000 mL

## 2015-01-18 MED ORDER — HYDROMORPHONE HCL 1 MG/ML IJ SOLN
1.0000 mg | Freq: Once | INTRAMUSCULAR | Status: AC
  Administered 2015-01-18: 1 mg via INTRAMUSCULAR
  Filled 2015-01-18: qty 1

## 2015-01-18 MED ORDER — HYDROCODONE-ACETAMINOPHEN 5-325 MG PO TABS
2.0000 | ORAL_TABLET | Freq: Once | ORAL | Status: AC
  Administered 2015-01-18: 2 via ORAL
  Filled 2015-01-18: qty 2

## 2015-01-18 MED ORDER — LIDOCAINE HCL (PF) 1 % IJ SOLN
INTRAMUSCULAR | Status: AC
  Administered 2015-01-18: 14:00:00
  Filled 2015-01-18: qty 5

## 2015-01-18 MED ORDER — BUPIVACAINE HCL (PF) 0.25 % IJ SOLN
INTRAMUSCULAR | Status: DC | PRN
  Administered 2015-01-18: 10 mL

## 2015-01-18 MED ORDER — PROMETHAZINE HCL 25 MG/ML IJ SOLN: 6.2500 mg | INTRAMUSCULAR | Status: DC | PRN

## 2015-01-18 MED ORDER — ONDANSETRON HCL 4 MG/2ML IJ SOLN
4.0000 mg | Freq: Once | INTRAMUSCULAR | Status: AC
  Administered 2015-01-18: 4 mg via INTRAVENOUS
  Filled 2015-01-18: qty 2

## 2015-01-18 MED ORDER — BSS IO SOLN
INTRAOCULAR | Status: AC
  Filled 2015-01-18: qty 15

## 2015-01-18 MED ORDER — OXYCODONE HCL 5 MG/5ML PO SOLN: 5.0000 mg | Freq: Once | ORAL | Status: AC | PRN

## 2015-01-18 MED ORDER — LIDOCAINE HCL (CARDIAC) 20 MG/ML IV SOLN
INTRAVENOUS | Status: DC | PRN
  Administered 2015-01-18: 70 mg via INTRAVENOUS

## 2015-01-18 MED ORDER — OXYCODONE-ACETAMINOPHEN 5-325 MG PO TABS
2.0000 | ORAL_TABLET | Freq: Once | ORAL | Status: AC
  Administered 2015-01-18: 2 via ORAL
  Filled 2015-01-18: qty 2

## 2015-01-18 MED ORDER — OXYCODONE HCL 5 MG PO TABS: 5.0000 mg | ORAL_TABLET | Freq: Once | ORAL | Status: AC | PRN

## 2015-01-18 MED ORDER — OXYCODONE-ACETAMINOPHEN 5-325 MG PO TABS: ORAL_TABLET | ORAL | Status: DC

## 2015-01-18 MED ORDER — MIDAZOLAM HCL 5 MG/5ML IJ SOLN
INTRAMUSCULAR | Status: DC | PRN
  Administered 2015-01-18 (×2): 1 mg via INTRAVENOUS

## 2015-01-18 MED ORDER — PROPOFOL 10 MG/ML IV BOLUS
INTRAVENOUS | Status: DC | PRN
  Administered 2015-01-18: 200 mg via INTRAVENOUS

## 2015-01-18 MED ORDER — ONDANSETRON HCL 8 MG PO TABS
4.0000 mg | ORAL_TABLET | Freq: Once | ORAL | Status: AC
  Administered 2015-01-18: 4 mg via ORAL
  Filled 2015-01-18: qty 1

## 2015-01-18 MED ORDER — TETANUS-DIPHTH-ACELL PERTUSSIS 5-2.5-18.5 LF-MCG/0.5 IM SUSP
0.5000 mL | Freq: Once | INTRAMUSCULAR | Status: AC
  Administered 2015-01-18: 0.5 mL via INTRAMUSCULAR
  Filled 2015-01-18: qty 0.5

## 2015-01-18 MED ORDER — LACTATED RINGERS IV SOLN
INTRAVENOUS | Status: DC | PRN
  Administered 2015-01-18: 22:00:00 via INTRAVENOUS

## 2015-01-18 SURGICAL SUPPLY — 35 items
BNDG COHESIVE 1X5 TAN STRL LF (GAUZE/BANDAGES/DRESSINGS) ×2 IMPLANT
BNDG ESMARK 4X9 LF (GAUZE/BANDAGES/DRESSINGS) ×2 IMPLANT
CORDS BIPOLAR (ELECTRODE) ×2 IMPLANT
COVER SURGICAL LIGHT HANDLE (MISCELLANEOUS) ×2 IMPLANT
GAUZE SPONGE 4X4 12PLY STRL (GAUZE/BANDAGES/DRESSINGS) ×2 IMPLANT
GAUZE XEROFORM 1X8 LF (GAUZE/BANDAGES/DRESSINGS) ×2 IMPLANT
GLOVE BIO SURGEON STRL SZ7.5 (GLOVE) ×2 IMPLANT
GLOVE BIOGEL PI IND STRL 8 (GLOVE) ×1 IMPLANT
GLOVE BIOGEL PI INDICATOR 8 (GLOVE) ×1
GOWN STRL REUS W/ TWL LRG LVL3 (GOWN DISPOSABLE) ×1 IMPLANT
GOWN STRL REUS W/TWL LRG LVL3 (GOWN DISPOSABLE) ×1
K-WIRE 4.0X.028 (WIRE) ×2 IMPLANT
KIT BASIN OR (CUSTOM PROCEDURE TRAY) ×2 IMPLANT
KIT ROOM TURNOVER OR (KITS) ×2 IMPLANT
MANIFOLD NEPTUNE II (INSTRUMENTS) ×2 IMPLANT
NEEDLE HYPO 25X1 1.5 SAFETY (NEEDLE) ×2 IMPLANT
NS IRRIG 1000ML POUR BTL (IV SOLUTION) ×2 IMPLANT
PACK ORTHO EXTREMITY (CUSTOM PROCEDURE TRAY) ×2 IMPLANT
PAD ARMBOARD 7.5X6 YLW CONV (MISCELLANEOUS) ×4 IMPLANT
SCRUB BETADINE 4OZ XXX (MISCELLANEOUS) ×2 IMPLANT
SET CYSTO W/LG BORE CLAMP LF (SET/KITS/TRAYS/PACK) ×2 IMPLANT
SOLUTION BETADINE 4OZ (MISCELLANEOUS) ×2 IMPLANT
SPLINT FINGER (SOFTGOODS) ×4 IMPLANT
SPONGE GAUZE 4X4 12PLY STER LF (GAUZE/BANDAGES/DRESSINGS) ×2 IMPLANT
SPONGE LAP 18X18 X RAY DECT (DISPOSABLE) ×2 IMPLANT
SUCTION FRAZIER TIP 10 FR DISP (SUCTIONS) ×2 IMPLANT
SUT CHROMIC 6 0 PS 4 (SUTURE) ×2 IMPLANT
SUT ETHILON 4 0 PS 2 18 (SUTURE) ×2 IMPLANT
SUT MON AB 5-0 P3 18 (SUTURE) ×2 IMPLANT
SYR CONTROL 10ML LL (SYRINGE) ×2 IMPLANT
TOWEL OR 17X24 6PK STRL BLUE (TOWEL DISPOSABLE) ×2 IMPLANT
TOWEL OR 17X26 10 PK STRL BLUE (TOWEL DISPOSABLE) ×2 IMPLANT
TUBE CONNECTING 12X1/4 (SUCTIONS) ×2 IMPLANT
UNDERPAD 30X30 INCONTINENT (UNDERPADS AND DIAPERS) ×2 IMPLANT
WATER STERILE IRR 1000ML POUR (IV SOLUTION) ×2 IMPLANT

## 2015-01-18 NOTE — ED Notes (Signed)
Laceration to left middle, laceration occurred circular saw, approx 1 hour ago.

## 2015-01-18 NOTE — Transfer of Care (Signed)
Immediate Anesthesia Transfer of Care Note  Patient: Alexis Singleton  Procedure(s) Performed: Procedure(s): IRRIGATION AND DEBRIDEMENT, PINNING  AND  REPAIR NAIL BED LEFT MIDDLE FINGER (Left)  Patient Location: PACU  Anesthesia Type:General  Level of Consciousness: awake  Airway & Oxygen Therapy: Patient Spontanous Breathing  Post-op Assessment: Report given to RN and Post -op Vital signs reviewed and stable  Post vital signs: Reviewed and stable  Last Vitals:  Filed Vitals:   01/18/15 2100  BP: 117/61  Pulse: 75  Temp:   Resp:     Complications: No apparent anesthesia complications

## 2015-01-18 NOTE — ED Notes (Signed)
Pt working with a circular saw and lacerated her left 3rd finger.  Bleeding controlled with pressure dressing.

## 2015-01-18 NOTE — Op Note (Signed)
[id number] 

## 2015-01-18 NOTE — ED Notes (Signed)
MD at bedside. 
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
## 2015-01-18 NOTE — Brief Op Note (Signed)
01/18/2015  11:22 PM  PATIENT:  Alexis Singleton  34 y.o. female  PRE-OPERATIVE DIAGNOSIS:  saw injury  POST-OPERATIVE DIAGNOSIS:  saw injury  PROCEDURE:  Procedure(s): IRRIGATION AND DEBRIDEMENT, PINNING  AND  REPAIR NAIL BED LEFT MIDDLE FINGER (Left)  SURGEON:  Surgeon(s) and Role:    * Leanora Cover, MD - Primary  PHYSICIAN ASSISTANT:   ASSISTANTS: none   ANESTHESIA:   general  EBL:     BLOOD ADMINISTERED:none  DRAINS: none   LOCAL MEDICATIONS USED:  MARCAINE     SPECIMEN:  No Specimen  DISPOSITION OF SPECIMEN:  N/A  COUNTS:  YES  TOURNIQUET:   Total Tourniquet Time Documented: Upper Arm (Left) - 40 minutes Total: Upper Arm (Left) - 40 minutes   DICTATION: .Other Dictation: Dictation Number [id number]  PLAN OF CARE: Discharge to home after PACU  PATIENT DISPOSITION:  PACU - hemodynamically stable.

## 2015-01-18 NOTE — ED Notes (Signed)
EDP in to see pt and evaluate fx and laceration of finger.

## 2015-01-18 NOTE — ED Notes (Signed)
Saline guaze dsg applied to injured finger per EDP orders

## 2015-01-18 NOTE — ED Provider Notes (Signed)
CSN: [id number]     Arrival date & time 01/18/15  1232 History   First MD Initiated Contact with Patient 01/18/15 1341     Chief Complaint  Patient presents with  . Finger Injury  . Laceration     (Consider location/radiation/quality/duration/timing/severity/associated sxs/prior Treatment) HPI Comments: Patient presents today with a chief complaint of a finger laceration to the distal aspect of the left 3rd finger.  She states that just prior to arrival she was using a circular saw and cut her finger.  She states that she is currently having throbbing pain of the finger.  She has not taken anything for pain prior to arrival.  She reports some numbness of the distal aspect of the finger.  She reports that her last Tetanus was in 2008.  She is right handed.  She is a violinist.    The history is provided by the patient.    Past Medical History  Diagnosis Date  . Anemia aplastic aregenerative   . Ovarian cancer   . Thyroid cancer   . Breast cancer   . Uterine cancer   . Pulmonary embolism   . Fibromyalgia   . Spinal compression fracture   . IC (interstitial cystitis)   . Chronic pain   . Arthritis   . Seizures    Past Surgical History  Procedure Laterality Date  . Abdominal hysterectomy    . Thyroidectomy    . Appendectomy    . Cholecystectomy    . Gastric bypass    . Breast lumpectomy    . Combined augmentation mammaplasty and abdominoplasty    . Sinusotomy    . Abdominal exploration surgery     Family History  Problem Relation Age of Onset  . Alcohol abuse Father    History  Substance Use Topics  . Smoking status: Never Smoker   . Smokeless tobacco: Not on file  . Alcohol Use: Yes     Comment: occassionally   OB History    No data available     Review of Systems  All other systems reviewed and are negative.     Allergies  Ivp dye and Other  Home Medications   Prior to Admission medications   Medication Sig Start Date End Date Taking? Authorizing  Provider  cyanocobalamin 1000 MCG tablet Inject 100 mcg into the muscle daily.   Yes Historical Provider, MD  ergocalciferol (VITAMIN D2) 50000 UNITS capsule Take 50,000 Units by mouth once a week.   Yes Historical Provider, MD  lisdexamfetamine (VYVANSE) 50 MG capsule Take 50 mg by mouth daily.   Yes Historical Provider, MD  ondansetron (ZOFRAN-ODT) 4 MG disintegrating tablet Take 4 mg by mouth every 8 (eight) hours as needed for nausea or vomiting.   Yes Historical Provider, MD  oxyCODONE (OXY IR/ROXICODONE) 5 MG immediate release tablet Take 5 mg by mouth every 4 (four) hours as needed for severe pain.   Yes Historical Provider, MD  pentosan polysulfate (ELMIRON) 100 MG capsule Take 100 mg by mouth 3 (three) times daily.   Yes Historical Provider, MD  tiZANidine (ZANAFLEX) 4 MG capsule Take 4 mg by mouth 3 (three) times daily.   Yes Historical Provider, MD  Diclofenac-Misoprostol (ARTHROTEC PO) Take by mouth.    Historical Provider, MD  DULoxetine (CYMBALTA) 20 MG capsule Take 60 mg by mouth daily.     Historical Provider, MD  eletriptan (RELPAX) 20 MG tablet Take 20 mg by mouth as needed for migraine or headache. One tablet by  mouth at onset of headache. May repeat in 2 hours if headache persists or recurs.    Historical Provider, MD  gabapentin (NEURONTIN) 600 MG tablet TAKE 1 TABLET BY MOUTH FOUR TIMES DAILY, AFTER MEALS AND AT BEDTIME Patient taking differently: take 800mg  3x daily 05/09/14   Charlett Blake, MD  ibuprofen (ADVIL,MOTRIN) 800 MG tablet Take 800 mg by mouth every 8 (eight) hours as needed for pain.    Historical Provider, MD  levothyroxine (SYNTHROID, LEVOTHROID) 88 MCG tablet Take 88 mcg by mouth daily before breakfast.    Historical Provider, MD  methocarbamol (ROBAXIN) 500 MG tablet Take 1 tablet (500 mg total) by mouth 2 (two) times daily. 07/29/13   [name], PA-C  Multiple Vitamin (MULTIVITAMIN) capsule Inject 1 capsule as directed daily.    Historical Provider, MD   naproxen (NAPROSYN) 500 MG tablet Take 500 mg by mouth 2 (two) times daily with a meal.    Historical Provider, MD  pantoprazole (PROTONIX) 20 MG tablet Take 1 tablet (20 mg total) by mouth daily. 07/29/13   Eastyn Dattilo, PA-C  solifenacin (VESICARE) 10 MG tablet Take by mouth daily.    Historical Provider, MD  thyroid (ARMOUR) 60 MG tablet Take 60 mg by mouth daily before breakfast.    Historical Provider, MD  traMADol (ULTRAM) 50 MG tablet Take 1 tablet (50 mg total) by mouth 2 (two) times daily. 05/10/14   Charlett Blake, MD   BP 144/94 mmHg  Pulse 77  Temp(Src) 98.7 F (37.1 C) (Oral)  Resp 18  Ht 5\' 6"  (1.676 m)  Wt 140 lb (63.504 kg)  BMI 22.61 kg/m2  SpO2 99% Physical Exam  Constitutional: She appears well-developed and well-nourished.  HENT:  Head: Normocephalic and atraumatic.  Neck: Normal range of motion. Neck supple.  Cardiovascular: Normal rate, regular rhythm and normal heart sounds.   Pulmonary/Chest: Effort normal and breath sounds normal.  Neurological: She is alert.  Sensation of the left 3rd digit slightly decreased.  Skin: Skin is warm and dry.  Laceration of the distal left 3rd finger involving the nail.  See picture below.    Psychiatric: She has a normal mood and affect.  Nursing note and vitals reviewed.   ED Course  Procedures (including critical care time) Labs Review Labs Reviewed - No data to display  Imaging Review No results found.   EKG Interpretation None      MDM   Final diagnoses:  None         3:30 PM Patient discussed with Dr. Fredna Dow who recommends keeping the patient NPO and transferring to the ED at [facility].  He reports that he will see the patient in the ED.    3:40 PM Discussed with Charge RN [place] at [facility] who is aware of the patient being transferred and the plan.  3:45 PM Discussed with Irena Cords, PA-C at [facility] who is also aware of the patient being transferred and the current  plan.    Patient presents today with a laceration of the left 3rd digit from cutting herself with a circular saw.  Laceration involving the nail bed.  Comminuted fracture of of the finger.  Hand surgery consulted and patient transferred to [facility] cone.  She will be seen by Dr. Fredna Dow and [facility] and likely taken to the OR.    [name], PA-C 01/20/15 2047  Charlesetta Shanks, MD 01/21/15 [phone]

## 2015-01-18 NOTE — Anesthesia Procedure Notes (Signed)
Procedure Name: LMA Insertion Date/Time: 01/18/2015 10:34 PM Performed by: Maude Leriche D Pre-anesthesia Checklist: Patient identified, Emergency Drugs available, Suction available, Patient being monitored and Timeout performed Patient Re-evaluated:Patient Re-evaluated prior to inductionOxygen Delivery Method: Circle system utilized Preoxygenation: Pre-oxygenation with 100% oxygen Intubation Type: IV induction Ventilation: Mask ventilation without difficulty LMA: LMA inserted LMA Size: 4.0 Number of attempts: 1 Placement Confirmation: positive ETCO2 and breath sounds checked- equal and bilateral Tube secured with: Tape Dental Injury: Teeth and Oropharynx as per pre-operative assessment

## 2015-01-18 NOTE — ED Notes (Signed)
Tetanus shot last in 2008

## 2015-01-18 NOTE — ED Notes (Signed)
Per PA-C, Charge RN, [place], [place] ED and PA-C at [facility] ED aware of pts arrival there to see Dr Fredna Dow, MD for finger injury.
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
## 2015-01-18 NOTE — H&P (Signed)
Alexis Singleton is an 34 y.o. female.   Chief Complaint: left long finger saw injury HPI: 34 yo rhd female states she injured left long finger on circular saw earlier today while trying to fix dog door.  Seen at [facility] where XR revealed distal phalanx fracture.  Transferred to [facility] for further care.  Reports no previous injury to left long finger and no other injury at this time.  Past Medical History  Diagnosis Date  . Anemia aplastic aregenerative   . Ovarian cancer   . Thyroid cancer   . Breast cancer   . Uterine cancer   . Pulmonary embolism   . Fibromyalgia   . Spinal compression fracture   . IC (interstitial cystitis)   . Chronic pain   . Arthritis   . Seizures     Past Surgical History  Procedure Laterality Date  . Abdominal hysterectomy    . Thyroidectomy    . Appendectomy    . Cholecystectomy    . Gastric bypass    . Breast lumpectomy    . Combined augmentation mammaplasty and abdominoplasty    . Sinusotomy    . Abdominal exploration surgery      Family History  Problem Relation Age of Onset  . Alcohol abuse Father    Social History:  reports that she has never smoked. She does not have any smokeless tobacco history on file. She reports that she drinks alcohol. She reports that she does not use illicit drugs.  Allergies:  Allergies  Allergen Reactions  . Ioxaglate Anaphylaxis  . Ivp Dye [Iodinated Diagnostic Agents] Anaphylaxis  . Other Anaphylaxis    Seafood   . Tramadol Other (See Comments)    Seizures  . Carbamazepine Other (See Comments)    Headache  . Morphine Itching  . Topiramate Nausea Only  . Valproic Acid Nausea Only     (Not in a hospital admission)  No results found for this or any previous visit (from the past 48 hour(s)).  Dg Finger Middle Left  01/18/2015   CLINICAL DATA:  Working with circular saw and cut left middle finger  EXAM: LEFT MIDDLE FINGER 2+V  COMPARISON:  None.  FINDINGS: There is a comminuted fracture deformity involving  the distal aspect of the distal phalanx. There is overlying soft tissue laceration. No dislocations.  IMPRESSION: 1. Comminuted fracture involves the distal aspect of the distal phalanx. 2. Laceration.   Electronically Signed   By: Kerby Moors M.D.   On: 01/18/2015 14:38     A comprehensive review of systems was negative.  Blood pressure 117/61, pulse 75, temperature 98.7 F (37.1 C), temperature source Oral, resp. rate 14, height 5\' 6"  (1.676 m), weight 63.504 kg (140 lb), SpO2 97 %.  General appearance: alert, cooperative and appears stated age Head: Normocephalic, without obvious abnormality, atraumatic Neck: supple, symmetrical, trachea midline Resp: clear to auscultation bilaterally Cardio: regular rate and rhythm GI: non tender Extremities: intact sensation and capillary refill all digits except distal to wound on tip of long finger.  +epl/fpl/io.  intact flexion/extension dip joint.  longitudinal laceration on dorsum of long finger invovling edge of nail and coursin into volar pad distally.   Pulses: 2+ and symmetric Skin: Skin color, texture, turgor normal. No rashes or lesions Neurologic: Grossly normal Incision/Wound: As above  Assessment/Plan Left long finger saw injury with open distal phalanx fracture and nail bed injury.  Recommend OR for irrigation and debridement, possible pinning of fracture and repair of nail bed and  skin.  Risks, benefits, and alternatives of surgery were discussed and the patient agrees with the plan of care.   Eulalie Speights R 01/18/2015, 10:01 PM

## 2015-01-18 NOTE — ED Notes (Signed)
Spoke with pt regards to NPO status, pt has been NPO since 0930hrs this am, understands to not take in any PO fluids or solids.

## 2015-01-18 NOTE — Op Note (Signed)
Intra-operative fluoroscopic images in the AP, lateral, and oblique views were taken and evaluated by myself.  Reduction and hardware placement were confirmed.  There was no intraarticular penetration of permanent hardware.  

## 2015-01-18 NOTE — Discharge Instructions (Signed)

## 2015-01-19 MED ORDER — ONDANSETRON HCL 4 MG/2ML IJ SOLN
4.0000 mg | Freq: Once | INTRAMUSCULAR | Status: AC
  Administered 2015-01-19: 4 mg via INTRAVENOUS

## 2015-01-19 MED ORDER — ONDANSETRON HCL 4 MG/2ML IJ SOLN
INTRAMUSCULAR | Status: AC
  Filled 2015-01-19: qty 2

## 2015-01-19 NOTE — Anesthesia Postprocedure Evaluation (Signed)
  Anesthesia Post-op Note  Patient: Alexis Singleton  Procedure(s) Performed: Procedure(s): IRRIGATION AND DEBRIDEMENT, PINNING  AND  REPAIR NAIL BED LEFT MIDDLE FINGER (Left)  Patient Location: PACU  Anesthesia Type:General  Level of Consciousness: awake  Airway and Oxygen Therapy: Patient Spontanous Breathing  Post-op Pain: none  Post-op Assessment: Post-op Vital signs reviewed, Patient's Cardiovascular Status Stable, Respiratory Function Stable, Patent Airway, No signs of Nausea or vomiting and Pain level controlled              Post-op Vital Signs: Reviewed and stable  Last Vitals:  Filed Vitals:   01/19/15 0029  BP:   Pulse: 76  Temp:   Resp: 19    Complications: No apparent anesthesia complications

## 2015-01-19 NOTE — Op Note (Signed)
Alexis Singleton, Alexis Singleton              ACCOUNT NO.:  [id number]  MEDICAL RECORD NO.:  [id number]  LOCATION:  [facility]                         FACILITY:  [facility]  PHYSICIAN:  Leanora Cover, MD        DATE OF BIRTH:  01/25/1981  DATE OF PROCEDURE:  01/18/2015 DATE OF DISCHARGE:                              OPERATIVE REPORT   PREOPERATIVE DIAGNOSIS:  Left long finger table saw injury with open distal phalanx fracture and nail bed injury.  POSTOPERATIVE DIAGNOSIS:  Left long finger table saw injury with open distal phalanx fracture and nail bed injury.  PROCEDURE:   1. Left long finger irrigation and debridement of open fracture 2. Open reduction and percutaneous pinning of left long finger open distal phalanx fracture 3. Repair of left long finger skin laceration 4. Repair of left long finger nail bed laceration.  SURGEON:  Leanora Cover, MD  ASSISTANT:  None.  ANESTHESIA:  General.  IV FLUIDS:  Per anesthesia flow sheet.  ESTIMATED BLOOD LOSS:  Minimal.  COMPLICATIONS:  None.  SPECIMENS:  None.  TIME OF TOURNIQUET:  40 minutes.  DISPOSITION:  Stable to PACU.  INDICATIONS:  Alexis Singleton is a 34 year old female.  She is right-hand dominant.  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  She states she injured her left long finger earlier today, while using a circular saw.  She was seen at [facility] where she was evaluated.  Radiographs were taken revealing distal phalanx fracture.  She was transferred to [facility] for my care. On examination, she had decreased sensation at the ulnar tip of the finger distal to the wound which was at the distal aspect of the finger. There was a wound coursing longitudinally along the nail bed on the ulnar side as well.  Radiographs revealed a tuft fracture.  I recommended operative irrigation and debridement and possible pinning and repair of skin and nail bed lacerations.  Risks, benefits,  and alternatives of surgery were discussed including the risk of blood loss; infection; damage to nerves, vessels, tendons, ligaments, bone; failure of surgery; need for additional surgery; complications with wound healing; continued pain; nonunion; malunion; stiffness.  She voiced understanding of these risks and elected to proceed.  We also discussed that at the level of her laceration, there is nothing that can be done regarding nerve repair as the nerves were too small.  OPERATIVE COURSE:  After being identified preoperatively by myself, the patient and I agreed upon procedure and site of procedure.  Surgical site was marked.  The risks, benefits, and alternatives of surgery were reviewed, she wished to proceed.  Surgical consent had been signed.  She was transferred to the operating room and placed on the operating table in supine position with left upper extremity on arm board.  She was given IV Ancef as preoperative antibiotic prophylaxis.  Her tetanus had been updated.  General anesthesia was induced by anesthesiologist.  Left upper extremity was prepped and draped in normal sterile orthopedic fashion.  Surgical pause was performed between surgeons, anesthesia, operating staff, and all were in agreement as to the patient, procedure, and site of  procedure.  Tourniquet at the proximal aspect of the forearm was inflated to 250 mmHg after exsanguination of limb with an Esmarch bandage.  The wound was explored.  There was no significant gross contamination.  There were small bone fragments which were removed as they had been devitalized.  C-arm was used in AP, lateral, and oblique projections.  The tuft fracture was unstable.  A 0.028-inch K-wire was advanced from the tuft of the finger into the proximal aspect of the distal phalanx.  This adequately stabilized the tuft fracture.  I did not feel a larger K-wire would be able to be advanced through the distal fragment without splitting  it.  I did not want to advance the small K- wire across the DIP joint.  This did provide adequate stabilization. The C-arm was used in AP and lateral projections for appropriate reduction and position of hardware which was the case.  The fingernail was removed.  There was laceration through the nail bed on the ulnar side and then coursing more transversely near the lunula.  The terminal matrix appeared largely intact.  The wound and open fracture had been irrigated with 3000 mL of sterile saline by cysto tubing prior to the reduction and pinning.  The skin laceration volarly was reapproximated using 5-0 Monocryl suture in an interrupted fashion.  The nail bed was repaired with 6-0 chromic suture in an interrupted fashion.  This provided good apposition of all soft tissues.  There was a piece of cuticle dorsally that was repaired with a 6-0 chromic suture as well. The pin had been bent and cut short.  A digital block was performed with 10 mL of 0.25% plain Marcaine to aid in postoperative analgesia.  A piece of Xeroform was placed in the nail fold, and the wounds all dressed with sterile Xeroform and 4 x 4 and wrapped with a Coban dressing lightly.  An AlumaFoam splint was placed and wrapped lightly with a Coban dressing as well.  Tourniquet was deflated at 40 minutes. Fingertips were pink with brisk capillary refill after deflation of tourniquet.  Operative drapes were broken down.  The patient was awoken from anesthesia safely.  She was transferred back to the stretcher and taken to PACU in stable condition.  I will see her back in the office in 1 week for postoperative followup.  I will give her Norco 5/325, 1-2 p.o. q.6 hours p.r.n. pain, dispensed #30.     Leanora Cover, MD     KK/MEDQ  D:  01/18/2015  T:  01/19/2015  Job:  [id number]

## 2015-01-20 ENCOUNTER — Encounter (HOSPITAL_COMMUNITY): Payer: Self-pay | Admitting: Orthopedic Surgery

## 2015-01-29 ENCOUNTER — Ambulatory Visit: Payer: Medicaid Other | Attending: Orthopedic Surgery | Admitting: Occupational Therapy

## 2015-01-29 ENCOUNTER — Encounter: Payer: Self-pay | Admitting: Occupational Therapy

## 2015-01-29 DIAGNOSIS — M6289 Other specified disorders of muscle: Secondary | ICD-10-CM | POA: Insufficient documentation

## 2015-01-29 DIAGNOSIS — M79642 Pain in left hand: Secondary | ICD-10-CM

## 2015-01-29 DIAGNOSIS — M25642 Stiffness of left hand, not elsewhere classified: Secondary | ICD-10-CM

## 2015-01-29 DIAGNOSIS — R29898 Other symptoms and signs involving the musculoskeletal system: Secondary | ICD-10-CM

## 2015-01-29 NOTE — Therapy (Signed)
[facility] [address] [place], [place], [address] Phone: [phone]   Fax:  [phone]  Occupational Therapy Evaluation  Patient Details  Name: Alexis Singleton MRN: [id number] Date of Birth: January 28, 1981 Referring Provider:  Leanora Cover, MD  Encounter Date: 01/29/2015      OT End of Session - 01/29/15 1401    Visit Number 1   Date for OT Re-Evaluation 03/31/15   Authorization Type MCD   Authorization Time Period Awaiting authorization   OT Start Time 0845   OT Stop Time 1000   OT Time Calculation (min) 75 min   Equipment Utilized During Treatment splint   Activity Tolerance Patient tolerated treatment well      Past Medical History  Diagnosis Date  . Anemia aplastic aregenerative   . Ovarian cancer   . Thyroid cancer   . Breast cancer   . Uterine cancer   . Pulmonary embolism   . Fibromyalgia   . Spinal compression fracture   . IC (interstitial cystitis)   . Chronic pain   . Arthritis   . Seizures     Past Surgical History  Procedure Laterality Date  . Abdominal hysterectomy    . Thyroidectomy    . Appendectomy    . Cholecystectomy    . Gastric bypass    . Breast lumpectomy    . Combined augmentation mammaplasty and abdominoplasty    . Sinusotomy    . Abdominal exploration surgery    . I&d extremity Left 01/18/2015    Procedure: IRRIGATION AND DEBRIDEMENT, PINNING  AND  REPAIR NAIL BED LEFT MIDDLE FINGER;  Surgeon: Leanora Cover, MD;  Location: [facility];  Service: Orthopedics;  Laterality: Left;    There were no vitals filed for this visit.  Visit Diagnosis:  Stiffness of joint, hand, left - Plan: Ot plan of care cert/re-cert  Pain in joint, hand, left - Plan: Ot plan of care cert/re-cert  Weakness of left hand - Plan: Ot plan of care cert/re-cert      Subjective Assessment - 01/29/15 1003    Pertinent History surgery for Lt long finger 01/18/15, h/o cancer, fibromyalgia   Patient Stated  Goals To get my hand back so I can play the violin   Currently in Pain? Yes   Pain Score 7    Pain Location Finger (Comment which one)  long   Pain Orientation Left   Pain Descriptors / Indicators Throbbing;Sharp;Stabbing   Pain Type Acute pain   Pain Onset 1 to 4 weeks ago   Pain Frequency Constant   Aggravating Factors  hitting it   Pain Relieving Factors elevation, ice, meds           [facility] OT Assessment - 01/29/15 0001    Assessment   Diagnosis s/p pinning P3 fracture Lt long finger and nail bed repair  surgery 01/18/15   Onset Date 01/18/15  surgery   Assessment Pt arrived with pre-fab metal finger splint taco style and well wrapped/protected. Pt reports she pulled out pin accidentally from removing post surgical gauze. MD aware and re-assessed by MD (on 01/23/15) prior to this visit.   Precautions   Precautions Other (comment)   Precaution Comments splint on finger at all times   Required Braces or Orthoses Other Brace/Splint  finger splint per MD orders   Other Brace/Splint Therapist fabricated finger splint Lt long finger per MD orders, but with palmer bar for added security/prevention of sliding per pt request   Home  Environment   Lives With --  boyfriend and 77 y.o. 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 Therapist also clam shelled dorsally from tip of finger to PIP joint for nail bed protection. Pt also issued pre-fab distal phalanx fracture  splint to transition to when MD deems appropriate. Pt instructed not to use pre-fab splint until MD approves. Pt agreed               OT Education - 01/29/15 [phone]    Education provided Yes   Education Details splint wear and care, precautions   Person(s) Educated Patient   Methods Explanation;Handout   Comprehension Verbalized understanding          OT Short Term Goals - 01/29/15 1409    OT SHORT TERM GOAL #1   Title Independent w/ splint wear and care   Baseline issued at evaluation, but may need adjustments due to edema changes   Time 4   Period Weeks   Status On-going   OT SHORT TERM GOAL #2   Title Pt to report pain less than or equal to 5/10 at rest Lt long finger (due 03/01/15)   Baseline 7/10   Time 4   Period Weeks   Status New           OT Long Term Goals - 01/29/15 1410    OT LONG TERM GOAL #1   Title Independent w/ HEP (due 03/31/15)   Baseline DEPENDENT d/t current precautions   Time 8   Period Weeks   Status New   OT LONG TERM GOAL #2   Title Pt to have 90% or greater ROM Lt long finger to return to playing violin   Baseline not tested d/t current precautions   Time 8   Period Weeks   Status New   OT LONG TERM GOAL #3   Title Pt to demo 50% or greater grip strength Lt hand compared to Rt   Baseline dependent d/t current precautions   Time 8   Period Weeks   Status New               Plan - 01/29/15 1403    Clinical Impression Statement Pt is a 34 y.o. female who presents to outpatient rehab s/p pinning P3 fracture repair (18563) and nail bed repair on 01/18/15. Pt arrived in pre-fab metal finger splint (taco style) and well wrapped, but reported she had accidentally pulled out pin when removing post-surgical gauze previously (MD aware and has assessed per pt report). Splint fabricated and issued today per MD orders.    Pt will benefit from skilled therapeutic intervention in order to improve on the following deficits (Retired)  Pain;Increased edema;Decreased scar mobility;Decreased range of motion;Decreased strength;Impaired UE functional use;Decreased knowledge of precautions   Rehab Potential Good   OT Frequency --  3 visits over 8 week duration  OT Treatment/Interventions Self-care/ADL training;Therapeutic exercise;Splinting;Parrafin;Moist Heat;Compression bandaging;Fluidtherapy;Scar mobilization;Therapeutic exercises;Patient/family education;Manual Therapy;Passive range of motion;Therapeutic activities;Ultrasound   Plan splint adjustments prn, begin therapy when MD deems appropriate   Consulted and Agree with Plan of Care Patient        Problem List Patient Active Problem List   Diagnosis Date Noted  . Chronic back pain 02/14/2014  . Chronic neck pain 02/14/2014  . Fibromyalgia affecting multiple sites 02/14/2014  . Other chronic pain 02/14/2014    Carey Bullocks, OTR/L 01/29/2015, 2:19 PM  [facility] [address] [place], [place], [address] Phone: [phone]   Fax:  [phone]

## 2015-01-29 NOTE — Patient Instructions (Signed)
WEARING SCHEDULE:  ?Wear splint at ALL times except for hygiene care  ? ?PURPOSE:  ?To prevent movement and for protection until injury can heal ? ?CARE OF SPLINT:  ?Keep splint away from heat sources including: stove, radiator or furnace, or a car in sunlight. The splint can melt and will no longer fit you properly ? ?Keep away from pets and children ? ?Clean the splint with rubbing alcohol 1-2 times per day.  ?* During this time, make sure you also clean your hand/arm as instructed by your therapist and/or perform dressing changes as needed. Then dry hand/arm completely before replacing splint. (When cleaning hand/arm, keep it immobilized in same position until splint is replaced) ? ?PRECAUTIONS/POTENTIAL PROBLEMS: ?*If you notice or experience increased pain, swelling, numbness, or a lingering reddened area from the splint: ?Contact your therapist immediately by calling [phone]. You must wear the splint for protection, but we will get you scheduled for adjustments as quickly as possible.  ?(If only straps or hooks need to be replaced and NO adjustments to the splint need to be made, just call the office ahead and let them know you are coming in) ? ?If you have any medical concerns or signs of infection, please call your doctor immediately ?  ?

## 2015-02-10 ENCOUNTER — Ambulatory Visit: Payer: Medicaid Other | Admitting: Occupational Therapy

## 2015-02-12 ENCOUNTER — Ambulatory Visit: Payer: Medicaid Other | Admitting: Occupational Therapy

## 2015-02-12 ENCOUNTER — Encounter: Payer: Self-pay | Admitting: Occupational Therapy

## 2015-02-12 DIAGNOSIS — M79642 Pain in left hand: Secondary | ICD-10-CM

## 2015-02-12 DIAGNOSIS — M25642 Stiffness of left hand, not elsewhere classified: Secondary | ICD-10-CM

## 2015-02-12 NOTE — Therapy (Signed)
[facility] [address] [place], [place], [address] Phone: [phone]   Fax:  [phone]  Occupational Therapy Treatment  Patient Details  Name: Alexis Singleton MRN: [id number] Date of Birth: 1980/10/12 Referring Provider:  Therapy, [facility]  Encounter Date: 02/12/2015      OT End of Session - 02/12/15 1419    Visit Number 2   Date for OT Re-Evaluation 03/31/15   Authorization Type MCD   Authorization Time Period 02/07/15 - 05/09/15   Authorization - Visit Number 1   Authorization - Number of Visits 3   OT Start Time 0905   OT Stop Time 0930   OT Time Calculation (min) 25 min   Equipment Utilized During Treatment splint   Activity Tolerance Patient tolerated treatment well      Past Medical History  Diagnosis Date  . Anemia aplastic aregenerative   . Ovarian cancer   . Thyroid cancer   . Breast cancer   . Uterine cancer   . Pulmonary embolism   . Fibromyalgia   . Spinal compression fracture   . IC (interstitial cystitis)   . Chronic pain   . Arthritis   . Seizures     Past Surgical History  Procedure Laterality Date  . Abdominal hysterectomy    . Thyroidectomy    . Appendectomy    . Cholecystectomy    . Gastric bypass    . Breast lumpectomy    . Combined augmentation mammaplasty and abdominoplasty    . Sinusotomy    . Abdominal exploration surgery    . I&d extremity Left 01/18/2015    Procedure: IRRIGATION AND DEBRIDEMENT, PINNING  AND  REPAIR NAIL BED LEFT MIDDLE FINGER;  Surgeon: Leanora Cover, MD;  Location: [facility];  Service: Orthopedics;  Laterality: Left;    There were no vitals filed for this visit.  Visit Diagnosis:  Stiffness of joint, hand, left  Pain in joint, hand, left      Subjective Assessment - 02/12/15 1413    Subjective  "It's doing good. The Dr. told me I could move it and showed me blocking exercises" (for PIP and DIP)   Pertinent History surgery for Lt long  finger 01/18/15, h/o cancer, fibromyalgia   Patient Stated Goals To get my hand back so I can play the violin   Currently in Pain? Yes   Pain Score 3    Pain Location Finger (Comment which one)  LONG   Pain Orientation Left   Pain Descriptors / Indicators Aching;Throbbing   Pain Type Acute pain   Pain Onset 1 to 4 weeks ago   Pain Frequency Intermittent   Aggravating Factors  hitting it   Pain Relieving Factors rest, ice, meds                      OT Treatments/Exercises (OP) - 02/12/15 0001    ADLs   ADL Comments Assessed skin: pt with maceration at finger and educated pt in prevention including letting finger air dry, cleaning and drying finger and splint completely before donning, etc. Reviewed ex's MD had already shown her for PIP flexion/ext, and DIP flex/ext isolated and compositely   Splinting   Splinting Pt no longer wearing fabricated splint, but switched to pre-fab distal phalanx fracture splint. However, pt with decreased edema and now fitting loose. Therefore issued smaller size for better fit.                 OT  Education - 02/12/15 1418    Education provided Yes   Education Details prevention of maceration of finger   Person(s) Educated Patient   Methods Explanation   Comprehension Verbalized understanding          OT Short Term Goals - 02/12/15 1420    OT SHORT TERM GOAL #1   Title Independent w/ splint wear and care   Baseline issued at evaluation, but may need adjustments due to edema changes   Time 4   Period Weeks   Status Achieved   OT SHORT TERM GOAL #2   Title Pt to report pain less than or equal to 5/10 at rest Lt long finger (due 03/01/15)   Baseline 7/10   Time 4   Period Weeks   Status Achieved  02/12/15: 3-4/10            OT Long Term Goals - 01/29/15 1410    OT LONG TERM GOAL #1   Title Independent w/ HEP (due 03/31/15)   Baseline DEPENDENT d/t current precautions   Time 8   Period Weeks   Status New   OT LONG  TERM GOAL #2   Title Pt to have 90% or greater ROM Lt long finger to return to playing violin   Baseline not tested d/t current precautions   Time 8   Period Weeks   Status New   OT LONG TERM GOAL #3   Title Pt to demo 50% or greater grip strength Lt hand compared to Rt   Baseline dependent d/t current precautions   Time 8   Period Weeks   Status New               Plan - 02/12/15 1421    Clinical Impression Statement Pt met STG's today. Pt with decreased pain and cleared for A/ROM per pt report. Pt able to demo blocking ex's for finger with observably good motion at PIP and DIP   Plan Pt can return for 2 more approved visits prn if warranted by MD. Will need new order to progress to  strengthening. Otherwise, can d/c by Sept. if pt has not returned   Consulted and Agree with Plan of Care Patient        Problem List Patient Active Problem List   Diagnosis Date Noted  . Chronic back pain 02/14/2014  . Chronic neck pain 02/14/2014  . Fibromyalgia affecting multiple sites 02/14/2014  . Other chronic pain 02/14/2014    Carey Bullocks, OTR/L 02/12/2015, 2:24 PM  [facility] [address] [place], [place], [address] Phone: [phone]   Fax:  [phone]

## 2016-06-19 ENCOUNTER — Emergency Department (HOSPITAL_BASED_OUTPATIENT_CLINIC_OR_DEPARTMENT_OTHER)
Admission: EM | Admit: 2016-06-19 | Discharge: 2016-06-19 | Disposition: A | Discharge status: Home / Self Care | Payer: Medicaid Other | Attending: Emergency Medicine | Admitting: Emergency Medicine

## 2016-06-19 ENCOUNTER — Other Ambulatory Visit: Payer: Self-pay

## 2016-06-19 ENCOUNTER — Encounter (HOSPITAL_BASED_OUTPATIENT_CLINIC_OR_DEPARTMENT_OTHER): Payer: Self-pay | Admitting: Adult Health

## 2016-06-19 DIAGNOSIS — D509 Iron deficiency anemia, unspecified: Secondary | ICD-10-CM | POA: Diagnosis not present

## 2016-06-19 DIAGNOSIS — Z8543 Personal history of malignant neoplasm of ovary: Secondary | ICD-10-CM | POA: Diagnosis not present

## 2016-06-19 DIAGNOSIS — Z79899 Other long term (current) drug therapy: Secondary | ICD-10-CM | POA: Insufficient documentation

## 2016-06-19 DIAGNOSIS — L739 Follicular disorder, unspecified: Secondary | ICD-10-CM | POA: Diagnosis not present

## 2016-06-19 DIAGNOSIS — R079 Chest pain, unspecified: Secondary | ICD-10-CM | POA: Diagnosis present

## 2016-06-19 DIAGNOSIS — Z853 Personal history of malignant neoplasm of breast: Secondary | ICD-10-CM | POA: Insufficient documentation

## 2016-06-19 DIAGNOSIS — R002 Palpitations: Secondary | ICD-10-CM

## 2016-06-19 LAB — URINALYSIS, ROUTINE W REFLEX MICROSCOPIC
Glucose, UA: NEGATIVE mg/dL
Hgb urine dipstick: NEGATIVE
Ketones, ur: NEGATIVE mg/dL
NITRITE: POSITIVE — AB
Protein, ur: NEGATIVE mg/dL
SPECIFIC GRAVITY, URINE: 1.037 — AB (ref 1.005–1.030)
pH: 5.5 (ref 5.0–8.0)

## 2016-06-19 LAB — BASIC METABOLIC PANEL
Anion gap: 6 (ref 5–15)
BUN: 15 mg/dL (ref 6–20)
CHLORIDE: 109 mmol/L (ref 101–111)
CO2: 24 mmol/L (ref 22–32)
Calcium: 8.6 mg/dL — ABNORMAL LOW (ref 8.9–10.3)
Creatinine, Ser: 0.64 mg/dL (ref 0.44–1.00)
GFR calc Af Amer: >60 mL/min (ref >60)
GFR calc non Af Amer: >60 mL/min (ref >60)
Glucose, Bld: 125 mg/dL — ABNORMAL HIGH (ref 65–99)
POTASSIUM: 3.9 mmol/L (ref 3.5–5.1)
SODIUM: 139 mmol/L (ref 135–145)

## 2016-06-19 LAB — URINE MICROSCOPIC-ADD ON: RBC / HPF: NONE SEEN RBC/hpf (ref 0–5)

## 2016-06-19 LAB — CBC WITH DIFFERENTIAL/PLATELET
BASOS PCT: 0 %
Basophils Absolute: 0 10*3/uL (ref 0.0–0.1)
Eosinophils Absolute: 0.1 10*3/uL (ref 0.0–0.7)
Eosinophils Relative: 2 %
HCT: 28.5 % — ABNORMAL LOW (ref 36.0–46.0)
HEMOGLOBIN: 8.8 g/dL — AB (ref 12.0–15.0)
LYMPHS ABS: 1.4 10*3/uL (ref 0.7–4.0)
Lymphocytes Relative: 23 %
MCH: 23.5 pg — ABNORMAL LOW (ref 26.0–34.0)
MCHC: 30.9 g/dL (ref 30.0–36.0)
MCV: 76.2 fL — ABNORMAL LOW (ref 78.0–100.0)
Monocytes Absolute: 0.5 10*3/uL (ref 0.1–1.0)
Monocytes Relative: 8 %
NEUTROS PCT: 67 %
Neutro Abs: 4.1 10*3/uL (ref 1.7–7.7)
Platelets: 335 10*3/uL (ref 150–400)
RBC: 3.74 MIL/uL — AB (ref 3.87–5.11)
RDW: 17.1 % — ABNORMAL HIGH (ref 11.5–15.5)
WBC: 6 10*3/uL (ref 4.0–10.5)

## 2016-06-19 LAB — TSH: TSH: 1.581 u[IU]/mL (ref 0.350–4.500)

## 2016-06-19 MED ORDER — FERROUS SULFATE 325 (65 FE) MG PO TABS: 325.0000 mg | ORAL_TABLET | Freq: Two times a day (BID) | ORAL | 1 refills | Status: AC

## 2016-06-19 MED ORDER — SULFAMETHOXAZOLE-TRIMETHOPRIM 800-160 MG PO TABS: 1.0000 | ORAL_TABLET | Freq: Two times a day (BID) | ORAL | 0 refills | Status: AC

## 2016-06-19 NOTE — ED Provider Notes (Signed)
[facility] Provider Note   CSN: [id number] Arrival date & time: 06/19/16  1442  By signing my name below, I, Emmanuella Mensah, attest that this documentation has been prepared under the direction and in the presence of Carlisle Cater, PA-C. Electronically Signed: Judithann Sauger, ED Scribe. 06/19/16. 5:30 PM.   History   Chief Complaint Chief Complaint  Patient presents with  . multiple complaints    HPI Comments: Alexis Singleton is a 35 y.o. female with a hx of anemia, breast cancer, fibromyalgia, IC, thyroid cancer, and uterine cancer who presents to the Emergency Department for evaluation for possible recurrent staph infections to her bilateral hands and "boils" to her pelvic region onset 2 months ago. She explains that she has been experiencing a recurrent rash to her hands with changes in her fingernails. She reports associated generalized chest pain with palpitations, intermittent fevers (highest 103.5 PTA), fluid retention, and increased weakness. She adds that she self catheterizes and she has noticed that her urine has had a foul smell. She states that she is here because she feels as though she has an infection "brewing" in her. No alleviating factors noted. She states that she has tried to make an appointment with her PCP on multiple occasions but has not been able to be evaluated for these symptoms. No alleviating factors noted. She reports that 2 months ago, she had an I&D to a staph infection to her arm and was placed on a course of antibiotics; no other medications noted. She denies any chills, abdominal pain, nausea, vomiting, shortness of breath, leg swelling, or any other symptoms,   The history is provided by the patient. No language interpreter was used.    Past Medical History:  Diagnosis Date  . Anemia aplastic aregenerative ([facility])   . Arthritis   . Breast cancer ([facility])   . Chronic pain   . Fibromyalgia   . IC (interstitial cystitis)   . Ovarian cancer  ([facility])   . Pulmonary embolism ([facility])   . Seizures ([facility])   . Spinal compression fracture ([facility])   . Thyroid cancer ([facility])   . Thyroid cancer ([facility])   . Uterine cancer [facility])     Patient Active Problem List   Diagnosis Date Noted  . Chronic back pain 02/14/2014  . Chronic neck pain 02/14/2014  . Fibromyalgia affecting multiple sites 02/14/2014  . Other chronic pain 02/14/2014    Past Surgical History:  Procedure Laterality Date  . ABDOMINAL EXPLORATION SURGERY    . ABDOMINAL HYSTERECTOMY    . APPENDECTOMY    . BREAST LUMPECTOMY    . CHOLECYSTECTOMY    . COMBINED AUGMENTATION MAMMAPLASTY AND ABDOMINOPLASTY    . GASTRIC BYPASS    . I&D EXTREMITY Left 01/18/2015   Procedure: IRRIGATION AND DEBRIDEMENT, PINNING  AND  REPAIR NAIL BED LEFT MIDDLE FINGER;  Surgeon: Leanora Cover, MD;  Location: [facility];  Service: Orthopedics;  Laterality: Left;  . SINUSOTOMY    . THYROIDECTOMY      OB History    No data available       Home Medications    Prior to Admission medications   Medication Sig Start Date End Date Taking? Authorizing Provider  busPIRone (BUSPAR) 15 MG tablet Take 15 mg by mouth 2 (two) times daily.   Yes Historical Provider, MD  DULoxetine (CYMBALTA) 60 MG capsule Take 60 mg by mouth daily.   Yes Historical Provider, MD  ergocalciferol (VITAMIN D2) 50000 UNITS capsule Take 50,000 Units by mouth once  a week.   Yes Historical Provider, MD  gabapentin (NEURONTIN) 800 MG tablet Take 500 mg by mouth 3 (three) times daily.    Yes Historical Provider, MD  lisdexamfetamine (VYVANSE) 60 MG capsule Take 60 mg by mouth every morning.   Yes Historical Provider, MD  ondansetron (ZOFRAN) 8 MG tablet Take 8 mg by mouth every 8 (eight) hours as needed for nausea or vomiting.   Yes Historical Provider, MD  ondansetron (ZOFRAN-ODT) 8 MG disintegrating tablet Take 8 mg by mouth every 8 (eight) hours as needed for nausea or vomiting.   Yes Historical Provider, MD  oxyCODONE (OXY IR/ROXICODONE) 5 MG  immediate release tablet Take 5 mg by mouth every 4 (four) hours as needed for severe pain.   Yes Historical Provider, MD  SUMAtriptan (IMITREX) 6 MG/0.5ML SOLN injection Inject 6 mg into the skin every 2 (two) hours as needed for migraine or headache. May repeat in 2 hours if headache persists or recurs.   Yes Historical Provider, MD  thyroid (ARMOUR) 60 MG tablet Take 60 mg by mouth daily before breakfast.   Yes Historical Provider, MD  thyroid (ARMOUR) 60 MG tablet Take 60 mg by mouth daily before breakfast.   Yes Historical Provider, MD  tiZANidine (ZANAFLEX) 4 MG capsule Take 4 mg by mouth 5 (five) times daily.    Yes Historical Provider, MD  traZODone (DESYREL) 50 MG tablet Take 100 mg by mouth at bedtime.   Yes Historical Provider, MD  cyanocobalamin 1000 MCG tablet Inject 1,000 mcg into the muscle daily.     Historical Provider, MD  Cyanocobalamin 1000 MCG/ML KIT Inject 1,000 mg as directed every 30 (thirty) days.    Historical Provider, MD  Diclofenac-Misoprostol (ARTHROTEC PO) Take 1 tablet by mouth as needed (for pain).     Historical Provider, MD  eletriptan (RELPAX) 20 MG tablet Take 20 mg by mouth as needed for migraine or headache. One tablet by mouth at onset of headache. May repeat in 2 hours if headache persists or recurs.    Historical Provider, MD  lidocaine (XYLOCAINE) 1 % (with preservative) injection 2 mLs by Other route as needed (for bladder).    Historical Provider, MD  lisdexamfetamine (VYVANSE) 50 MG capsule Take 50 mg by mouth daily.    Historical Provider, MD  Meth-Hyo-M Bl-Na Phos-Ph Sal (URIBEL) 118 MG CAPS Take 118 mg by mouth as needed (for bladder).  10/02/14   Historical Provider, MD  Multiple Vitamin (MULTIVITAMIN) capsule Take 1 capsule by mouth daily.     Historical Provider, MD  oxyCODONE-acetaminophen (PERCOCET) 5-325 MG per tablet 1-2 tabs po q6 hours prn pain Patient not taking: Reported on 01/29/2015 01/18/15   Leanora Cover, MD  pantoprazole (PROTONIX) 20 MG  tablet Take 1 tablet (20 mg total) by mouth daily. Patient taking differently: Take 40 mg by mouth daily.  07/29/13   Heather Laisure, PA-C  pentosan polysulfate (ELMIRON) 100 MG capsule Place 100 mg into the urethra as needed (for bladder).     Historical Provider, MD  sodium bicarbonate 8.4% (1 mEq/mL) SOLN infusion Place into the urethra as directed.     Historical Provider, MD  solifenacin (VESICARE) 10 MG tablet Take 10 mg by mouth daily.     Historical Provider, MD  sulfamethoxazole-trimethoprim (BACTRIM DS) 800-160 MG per tablet Take 1 tablet by mouth 2 (two) times daily. 01/18/15   Leanora Cover, MD    Family History Family History  Problem Relation Age of Onset  . Alcohol abuse  Father     Social History Social History  Substance Use Topics  . Smoking status: Never Smoker  . Smokeless tobacco: Not on file  . Alcohol use Yes     Comment: occassionally     Allergies   Ioxaglate; Ivp dye [iodinated diagnostic agents]; Other; Tramadol; Carbamazepine; Buprenorphine; Divalproex sodium; Levetiracetam; Metrizamide; Nsaids; Morphine; Topiramate; and Valproic acid   Review of Systems Review of Systems  Constitutional: Positive for fever. Negative for chills.  Respiratory: Negative for shortness of breath.   Cardiovascular: Positive for chest pain and palpitations. Negative for leg swelling.  Gastrointestinal: Negative for abdominal pain, nausea and vomiting.  Skin: Positive for rash and wound.  Neurological: Positive for weakness.     Physical Exam Updated Vital Signs BP 147/78   Pulse 107   Temp 98.1 F (36.7 C) (Oral)   Resp 18   Ht _0  (1.676 m)   Wt 145 lb (65.8 kg)   SpO2 99%   BMI 23.40 kg/m   Physical Exam  Constitutional: She appears well-developed and well-nourished.  HENT:  Head: Normocephalic and atraumatic.  Mouth/Throat: Oropharynx is clear and moist.  Mild pallor to mucous membranes.  Eyes: Conjunctivae are normal. Right eye exhibits no discharge.  Left eye exhibits no discharge.  Neck: Normal range of motion. Neck supple. No thyromegaly present.  Cardiovascular: Regular rhythm and normal heart sounds.  Tachycardia present.   Mild tachycardia  Pulmonary/Chest: Effort normal and breath sounds normal.  Abdominal: Soft. She exhibits no mass. There is no tenderness. There is no guarding.  Musculoskeletal: She exhibits no edema or tenderness.  Neurological: She is alert.  Skin: Skin is warm and dry.  Several scattered infected hair follicles in the pubic area without large abscess or fluctuance. Consistent with folliculitis.  Psychiatric: She has a normal mood and affect.  Nursing note and vitals reviewed.    ED Treatments / Results  DIAGNOSTIC STUDIES: Oxygen Saturation is 99% on RA, normal by my interpretation.    COORDINATION OF CARE: 5:05 PM- Pt advised of plan for treatment and pt agrees. Pt will receive lab work and EKG for further evaluation. Will provide resources for possible new PCP establishment for long term care and treatment.   Labs (all labs ordered are listed, but only abnormal results are displayed) Labs Reviewed  URINALYSIS, ROUTINE W REFLEX MICROSCOPIC (NOT AT [facility]) - Abnormal; Notable for the following:       Result Value   Color, Urine AMBER (*)    APPearance CLOUDY (*)    Specific Gravity, Urine 1.037 (*)    Bilirubin Urine SMALL (*)    Nitrite POSITIVE (*)    Leukocytes, UA TRACE (*)    All other components within normal limits  CBC WITH DIFFERENTIAL/PLATELET - Abnormal; Notable for the following:    RBC 3.74 (*)    Hemoglobin 8.8 (*)    HCT 28.5 (*)    MCV 76.2 (*)    MCH 23.5 (*)    RDW 17.1 (*)    All other components within normal limits  BASIC METABOLIC PANEL - Abnormal; Notable for the following:    Glucose, Bld 125 (*)    Calcium 8.6 (*)    All other components within normal limits  URINE MICROSCOPIC-ADD ON - Abnormal; Notable for the following:    Squamous Epithelial / LPF 6-30 (*)     Bacteria, UA MANY (*)    All other components within normal limits  TSH    ED ECG REPORT  Date: 06/19/2016  Rate: 116  Rhythm: sinus tachycardia  QRS Axis: normal  Intervals: normal  ST/T Wave abnormalities: normal  Conduction Disutrbances:none  Narrative Interpretation:   Old EKG Reviewed: none available  I have personally reviewed the EKG tracing and agree with the computerized printout as noted.  Procedures Procedures (including critical care time)   Initial Impression / Assessment and Plan / ED Course  Carlisle Cater, PA-C has reviewed the triage vital signs and the nursing notes.  Pertinent labs & imaging results that were available during my care of the patient were reviewed by me and considered in my medical decision making (see chart for details).  Clinical Course    Patient seen and examined.   Vital signs reviewed and are as follows: BP 142/63 (BP Location: Left Arm)   Pulse 86   Temp 98.1 F (36.7 C) (Oral)   Resp 20   Ht _0  (1.676 m)   Wt 65.8 kg   SpO2 99%   BMI 23.40 kg/m   Patient updated on results. For anemia will give iron supplementation. For folliculitis will give Bactrim. Informed and her UTI. TSH is pending and patient should have this followed up by PCP. Referrals given. Encouraged patient to return with any worsening symptoms, shortness of breath, syncope, worsening chest pain or other concerns. She verbalizes understanding and agrees with plan.  Final Clinical Impressions(s) / ED Diagnoses   Final diagnoses:  Folliculitis  Microcytic anemia  Palpitations   No dangerous or life-threatening conditions suspected or identified by history, physical exam, and by work-up. No indications for hospitalization identified.     New Prescriptions New Prescriptions   FERROUS SULFATE 325 (65 FE) MG TABLET    Take 1 tablet (325 mg total) by mouth 2 (two) times daily with a meal.   SULFAMETHOXAZOLE-TRIMETHOPRIM (BACTRIM DS,SEPTRA DS) 800-160 MG  TABLET    Take 1 tablet by mouth 2 (two) times daily.   I personally performed the services described in this documentation, which was scribed in my presence. The recorded information has been reviewed and is accurate.    Carlisle Cater, PA-C 06/19/16 1831    Charlesetta Shanks, MD 06/21/16 [phone]

## 2016-06-19 NOTE — ED Triage Notes (Addendum)
Presents with mutilple complaints for past month. Reports boils popping up in pelvic area, fever of 103 that is now resolved, dizziness for one month, CP that is generalized and joint pain that is generalized, SOB with Exertion. PT endorses bladder and kidney pain and reports "there are epithelial cells in my urine"

## 2016-06-19 NOTE — Discharge Instructions (Signed)
Please read and follow all provided instructions.  Your diagnoses today include:  1. Folliculitis   2. Microcytic anemia   3. Palpitations     Tests performed today include:  Vital signs. See below for your results today.   Blood counts and electrolytes - low red blood cells  Thyroid hormone - pending  Urine test - no infection  Medications prescribed:   Bactrim (trimethoprim/sulfamethoxazole) - antibiotic  You have been prescribed an antibiotic medicine: take the entire course of medicine even if you are feeling better. Stopping early can cause the antibiotic not to work.  Take any prescribed medications only as directed.   Home care instructions:   Follow any educational materials contained in this packet  Follow-up instructions: Please follow-up with your primary care provider in the next 1 week for further evaluation of your symptoms.   Return instructions:  Return to the Emergency Department if you have:  Fever  Worsening symptoms  Worsening pain  Worsening swelling  Redness of the skin that moves away from the affected area, especially if it streaks away from the affected area   Any other emergent concerns  Your vital signs today were: BP 139/75 (BP Location: Right Arm)    Pulse 91    Temp 98.1 F (36.7 C) (Oral)    Resp 22    Ht 5\' 6"  (1.676 m)    Wt 65.8 kg    SpO2 100%    BMI 23.40 kg/m  If your blood pressure (BP) was elevated above 135/85 this visit, please have this repeated by your doctor within one month. --------------

## 2016-06-19 NOTE — ED Notes (Signed)
States came today because her primary MD "just blows her off" . Has had rashes and changes in fingernails and toenails, generalized pain all over.

## 2016-06-19 NOTE — ED Notes (Signed)
Requested to do self cath, does self cath's at home. ED PA informed

## 2016-06-20 ENCOUNTER — Telehealth: Payer: Self-pay | Admitting: *Deleted

## 2016-06-20 NOTE — Telephone Encounter (Signed)
Changed Bactrim to Comparable Liquid per pt request

## 2018-01-24 ENCOUNTER — Other Ambulatory Visit: Payer: Self-pay

## 2018-01-24 ENCOUNTER — Emergency Department (HOSPITAL_COMMUNITY)
Admission: EM | Admit: 2018-01-24 | Discharge: 2018-01-24 | Disposition: A | Discharge status: Other Acute Inpatient Hospital | Payer: Medicaid Other | Attending: Emergency Medicine | Admitting: Emergency Medicine

## 2018-01-24 ENCOUNTER — Encounter (HOSPITAL_COMMUNITY): Payer: Self-pay | Admitting: *Deleted

## 2018-01-24 ENCOUNTER — Inpatient Hospital Stay (HOSPITAL_COMMUNITY)
Admission: AD | Admit: 2018-01-24 | Discharge: 2018-01-30 | DRG: 885 | Disposition: A | Discharge status: Home / Self Care | Payer: Medicaid Other | Source: Intra-hospital | Attending: Psychiatry | Admitting: Psychiatry

## 2018-01-24 DIAGNOSIS — F411 Generalized anxiety disorder: Secondary | ICD-10-CM | POA: Diagnosis not present

## 2018-01-24 DIAGNOSIS — F1123 Opioid dependence with withdrawal: Secondary | ICD-10-CM

## 2018-01-24 DIAGNOSIS — G4700 Insomnia, unspecified: Secondary | ICD-10-CM | POA: Diagnosis present

## 2018-01-24 DIAGNOSIS — M545 Low back pain: Secondary | ICD-10-CM | POA: Diagnosis present

## 2018-01-24 DIAGNOSIS — N3010 Interstitial cystitis (chronic) without hematuria: Secondary | ICD-10-CM | POA: Diagnosis present

## 2018-01-24 DIAGNOSIS — Z8542 Personal history of malignant neoplasm of other parts of uterus: Secondary | ICD-10-CM

## 2018-01-24 DIAGNOSIS — Z8543 Personal history of malignant neoplasm of ovary: Secondary | ICD-10-CM | POA: Insufficient documentation

## 2018-01-24 DIAGNOSIS — N3289 Other specified disorders of bladder: Secondary | ICD-10-CM | POA: Diagnosis present

## 2018-01-24 DIAGNOSIS — Z91013 Allergy to seafood: Secondary | ICD-10-CM

## 2018-01-24 DIAGNOSIS — K909 Intestinal malabsorption, unspecified: Secondary | ICD-10-CM | POA: Diagnosis present

## 2018-01-24 DIAGNOSIS — M542 Cervicalgia: Secondary | ICD-10-CM | POA: Diagnosis present

## 2018-01-24 DIAGNOSIS — K219 Gastro-esophageal reflux disease without esophagitis: Secondary | ICD-10-CM | POA: Diagnosis present

## 2018-01-24 DIAGNOSIS — M797 Fibromyalgia: Secondary | ICD-10-CM | POA: Diagnosis present

## 2018-01-24 DIAGNOSIS — Z9049 Acquired absence of other specified parts of digestive tract: Secondary | ICD-10-CM

## 2018-01-24 DIAGNOSIS — F609 Personality disorder, unspecified: Secondary | ICD-10-CM | POA: Diagnosis present

## 2018-01-24 DIAGNOSIS — R569 Unspecified convulsions: Secondary | ICD-10-CM | POA: Diagnosis present

## 2018-01-24 DIAGNOSIS — F322 Major depressive disorder, single episode, severe without psychotic features: Secondary | ICD-10-CM | POA: Insufficient documentation

## 2018-01-24 DIAGNOSIS — Z9109 Other allergy status, other than to drugs and biological substances: Secondary | ICD-10-CM

## 2018-01-24 DIAGNOSIS — F332 Major depressive disorder, recurrent severe without psychotic features: Secondary | ICD-10-CM | POA: Diagnosis present

## 2018-01-24 DIAGNOSIS — Z811 Family history of alcohol abuse and dependence: Secondary | ICD-10-CM

## 2018-01-24 DIAGNOSIS — F3181 Bipolar II disorder: Secondary | ICD-10-CM | POA: Diagnosis present

## 2018-01-24 DIAGNOSIS — E538 Deficiency of other specified B group vitamins: Secondary | ICD-10-CM | POA: Diagnosis present

## 2018-01-24 DIAGNOSIS — F319 Bipolar disorder, unspecified: Secondary | ICD-10-CM

## 2018-01-24 DIAGNOSIS — Z91041 Radiographic dye allergy status: Secondary | ICD-10-CM

## 2018-01-24 DIAGNOSIS — F410 Panic disorder [episodic paroxysmal anxiety] without agoraphobia: Secondary | ICD-10-CM | POA: Diagnosis present

## 2018-01-24 DIAGNOSIS — Z79899 Other long term (current) drug therapy: Secondary | ICD-10-CM

## 2018-01-24 DIAGNOSIS — E039 Hypothyroidism, unspecified: Secondary | ICD-10-CM | POA: Diagnosis present

## 2018-01-24 DIAGNOSIS — Z853 Personal history of malignant neoplasm of breast: Secondary | ICD-10-CM | POA: Diagnosis not present

## 2018-01-24 DIAGNOSIS — Z0489 Encounter for examination and observation for other specified reasons: Secondary | ICD-10-CM | POA: Insufficient documentation

## 2018-01-24 DIAGNOSIS — Z818 Family history of other mental and behavioral disorders: Secondary | ICD-10-CM | POA: Diagnosis not present

## 2018-01-24 DIAGNOSIS — Z9884 Bariatric surgery status: Secondary | ICD-10-CM | POA: Diagnosis not present

## 2018-01-24 DIAGNOSIS — G8929 Other chronic pain: Secondary | ICD-10-CM | POA: Diagnosis present

## 2018-01-24 DIAGNOSIS — Z86711 Personal history of pulmonary embolism: Secondary | ICD-10-CM

## 2018-01-24 DIAGNOSIS — Z85850 Personal history of malignant neoplasm of thyroid: Secondary | ICD-10-CM | POA: Insufficient documentation

## 2018-01-24 DIAGNOSIS — Z885 Allergy status to narcotic agent status: Secondary | ICD-10-CM

## 2018-01-24 DIAGNOSIS — D509 Iron deficiency anemia, unspecified: Secondary | ICD-10-CM | POA: Diagnosis present

## 2018-01-24 DIAGNOSIS — F3132 Bipolar disorder, current episode depressed, moderate: Secondary | ICD-10-CM | POA: Diagnosis not present

## 2018-01-24 DIAGNOSIS — F1193 Opioid use, unspecified with withdrawal: Secondary | ICD-10-CM

## 2018-01-24 DIAGNOSIS — G43909 Migraine, unspecified, not intractable, without status migrainosus: Secondary | ICD-10-CM | POA: Diagnosis present

## 2018-01-24 DIAGNOSIS — Z90710 Acquired absence of both cervix and uterus: Secondary | ICD-10-CM

## 2018-01-24 DIAGNOSIS — N3281 Overactive bladder: Secondary | ICD-10-CM | POA: Diagnosis present

## 2018-01-24 LAB — CBC WITH DIFFERENTIAL/PLATELET
Basophils Absolute: 0 10*3/uL (ref 0.0–0.1)
Basophils Relative: 0 %
EOS ABS: 0.1 10*3/uL (ref 0.0–0.7)
Eosinophils Relative: 1 %
HCT: 35.2 % — ABNORMAL LOW (ref 36.0–46.0)
Hemoglobin: 11.5 g/dL — ABNORMAL LOW (ref 12.0–15.0)
LYMPHS ABS: 0.7 10*3/uL (ref 0.7–4.0)
Lymphocytes Relative: 9 %
MCH: 28.5 pg (ref 26.0–34.0)
MCHC: 32.7 g/dL (ref 30.0–36.0)
MCV: 87.1 fL (ref 78.0–100.0)
MONOS PCT: 3 %
Monocytes Absolute: 0.3 10*3/uL (ref 0.1–1.0)
Neutro Abs: 6.6 10*3/uL (ref 1.7–7.7)
Neutrophils Relative %: 87 %
PLATELETS: 286 10*3/uL (ref 150–400)
RBC: 4.04 MIL/uL (ref 3.87–5.11)
RDW: 13.3 % (ref 11.5–15.5)
WBC: 7.7 10*3/uL (ref 4.0–10.5)

## 2018-01-24 LAB — URINALYSIS, ROUTINE W REFLEX MICROSCOPIC
Bilirubin Urine: NEGATIVE
GLUCOSE, UA: NEGATIVE mg/dL
HGB URINE DIPSTICK: NEGATIVE
Ketones, ur: NEGATIVE mg/dL
Leukocytes, UA: NEGATIVE
Nitrite: NEGATIVE
Protein, ur: NEGATIVE mg/dL
SPECIFIC GRAVITY, URINE: 1.021 (ref 1.005–1.030)
pH: 6 (ref 5.0–8.0)

## 2018-01-24 LAB — RAPID URINE DRUG SCREEN, HOSP PERFORMED
AMPHETAMINES: NOT DETECTED
Benzodiazepines: NOT DETECTED
Cocaine: NOT DETECTED
Opiates: NOT DETECTED
Tetrahydrocannabinol: POSITIVE — AB

## 2018-01-24 LAB — I-STAT BETA HCG BLOOD, ED (MC, WL, AP ONLY)

## 2018-01-24 LAB — COMPREHENSIVE METABOLIC PANEL
ALBUMIN: 3.8 g/dL (ref 3.5–5.0)
ALT: 17 U/L (ref 0–44)
AST: 18 U/L (ref 15–41)
Alkaline Phosphatase: 66 U/L (ref 38–126)
Anion gap: 9 (ref 5–15)
BUN: 13 mg/dL (ref 6–20)
CHLORIDE: 106 mmol/L (ref 98–111)
CO2: 22 mmol/L (ref 22–32)
Calcium: 8.7 mg/dL — ABNORMAL LOW (ref 8.9–10.3)
Creatinine, Ser: 0.5 mg/dL (ref 0.44–1.00)
GFR calc non Af Amer: >60 mL/min (ref >60)
Glucose, Bld: 112 mg/dL — ABNORMAL HIGH (ref 70–99)
Potassium: 3.7 mmol/L (ref 3.5–5.1)
Sodium: 137 mmol/L (ref 135–145)
Total Bilirubin: 0.7 mg/dL (ref 0.3–1.2)
Total Protein: 7 g/dL (ref 6.5–8.1)

## 2018-01-24 LAB — ACETAMINOPHEN LEVEL: Acetaminophen (Tylenol), Serum: <10 ug/mL — ABNORMAL LOW (ref 10–30)

## 2018-01-24 LAB — ETHANOL

## 2018-01-24 LAB — SALICYLATE LEVEL: Salicylate Lvl: <7 mg/dL (ref 2.8–30.0)

## 2018-01-24 LAB — TSH: TSH: 3.006 u[IU]/mL (ref 0.350–4.500)

## 2018-01-24 LAB — T4, FREE: Free T4: 0.84 ng/dL (ref 0.82–1.77)

## 2018-01-24 LAB — HCG, QUANTITATIVE, PREGNANCY: hCG, Beta Chain, Quant, S: <1 m[IU]/mL (ref <5)

## 2018-01-24 MED ORDER — FOLIC ACID 1 MG PO TABS
1.0000 mg | ORAL_TABLET | Freq: Every day | ORAL | Status: DC
  Administered 2018-01-24 – 2018-01-30 (×7): 1 mg via ORAL
  Filled 2018-01-24 (×10): qty 1

## 2018-01-24 MED ORDER — TIZANIDINE HCL 4 MG PO TABS
4.0000 mg | ORAL_TABLET | Freq: Three times a day (TID) | ORAL | Status: DC
  Administered 2018-01-24 – 2018-01-27 (×9): 4 mg via ORAL
  Filled 2018-01-24: qty 2
  Filled 2018-01-24 (×7): qty 1
  Filled 2018-01-24: qty 2
  Filled 2018-01-24 (×4): qty 1

## 2018-01-24 MED ORDER — OXYCODONE HCL 5 MG PO TABS: 5.0000 mg | ORAL_TABLET | Freq: Four times a day (QID) | ORAL | Status: DC | PRN

## 2018-01-24 MED ORDER — THYROID 30 MG PO TABS
30.0000 mg | ORAL_TABLET | Freq: Every day | ORAL | Status: DC
  Filled 2018-01-24: qty 1

## 2018-01-24 MED ORDER — ONDANSETRON 4 MG PO TBDP
4.0000 mg | ORAL_TABLET | Freq: Four times a day (QID) | ORAL | Status: DC | PRN
  Administered 2018-01-24 – 2018-01-26 (×4): 4 mg via ORAL
  Filled 2018-01-24 (×4): qty 1

## 2018-01-24 MED ORDER — MAGNESIUM HYDROXIDE 400 MG/5ML PO SUSP: 30.0000 mL | Freq: Every day | ORAL | Status: DC | PRN

## 2018-01-24 MED ORDER — HYDROXYZINE HCL 25 MG PO TABS
25.0000 mg | ORAL_TABLET | Freq: Four times a day (QID) | ORAL | Status: AC | PRN
  Administered 2018-01-24 – 2018-01-28 (×3): 25 mg via ORAL
  Filled 2018-01-24 (×3): qty 1

## 2018-01-24 MED ORDER — DULOXETINE HCL 60 MG PO CPEP
60.0000 mg | ORAL_CAPSULE | Freq: Two times a day (BID) | ORAL | Status: DC
  Administered 2018-01-24 – 2018-01-30 (×12): 60 mg via ORAL
  Filled 2018-01-24 (×19): qty 1

## 2018-01-24 MED ORDER — CHOLECALCIFEROL 10 MCG (400 UNIT) PO TABS
400.0000 [IU] | ORAL_TABLET | Freq: Every day | ORAL | Status: DC
  Administered 2018-01-24 – 2018-01-30 (×7): 400 [IU] via ORAL
  Filled 2018-01-24 (×10): qty 1

## 2018-01-24 MED ORDER — PENTOSAN POLYSULFATE SODIUM 100 MG PO CAPS
100.0000 mg | ORAL_CAPSULE | ORAL | Status: DC | PRN
  Filled 2018-01-24: qty 1

## 2018-01-24 MED ORDER — DICYCLOMINE HCL 20 MG PO TABS
20.0000 mg | ORAL_TABLET | Freq: Four times a day (QID) | ORAL | Status: DC | PRN
Start: 2018-01-24 — End: 2018-01-27

## 2018-01-24 MED ORDER — TIZANIDINE HCL 4 MG PO TABS: 4.0000 mg | ORAL_TABLET | Freq: Four times a day (QID) | ORAL | Status: DC | PRN

## 2018-01-24 MED ORDER — ONDANSETRON 8 MG PO TBDP: 8.0000 mg | ORAL_TABLET | Freq: Three times a day (TID) | ORAL | Status: DC | PRN

## 2018-01-24 MED ORDER — DARIFENACIN HYDROBROMIDE ER 15 MG PO TB24
15.0000 mg | ORAL_TABLET | Freq: Every day | ORAL | Status: DC
  Administered 2018-01-24 – 2018-01-30 (×7): 15 mg via ORAL
  Filled 2018-01-24 (×10): qty 1

## 2018-01-24 MED ORDER — PROSIGHT PO TABS
1.0000 | ORAL_TABLET | Freq: Every day | ORAL | Status: DC
  Administered 2018-01-25 – 2018-01-30 (×6): 1 via ORAL
  Filled 2018-01-24 (×8): qty 1

## 2018-01-24 MED ORDER — PANTOPRAZOLE SODIUM 40 MG PO TBEC
40.0000 mg | DELAYED_RELEASE_TABLET | Freq: Every day | ORAL | Status: DC
  Filled 2018-01-24 (×2): qty 1

## 2018-01-24 MED ORDER — TRIMETHOPRIM 100 MG PO TABS
100.0000 mg | ORAL_TABLET | Freq: Every day | ORAL | Status: DC
  Administered 2018-01-24 – 2018-01-30 (×7): 100 mg via ORAL
  Filled 2018-01-24 (×10): qty 1

## 2018-01-24 MED ORDER — BUSPIRONE HCL 5 MG PO TABS
5.0000 mg | ORAL_TABLET | Freq: Three times a day (TID) | ORAL | Status: DC
  Administered 2018-01-24 – 2018-01-26 (×5): 5 mg via ORAL
  Filled 2018-01-24 (×9): qty 1

## 2018-01-24 MED ORDER — PANTOPRAZOLE SODIUM 40 MG PO TBEC
40.0000 mg | DELAYED_RELEASE_TABLET | Freq: Every day | ORAL | Status: DC
  Administered 2018-01-24 – 2018-01-30 (×7): 40 mg via ORAL
  Filled 2018-01-24 (×11): qty 1

## 2018-01-24 MED ORDER — TRAZODONE HCL 100 MG PO TABS: 100.0000 mg | ORAL_TABLET | Freq: Every day | ORAL | Status: DC

## 2018-01-24 MED ORDER — BUSPIRONE HCL 10 MG PO TABS
15.0000 mg | ORAL_TABLET | Freq: Two times a day (BID) | ORAL | Status: DC
  Filled 2018-01-24 (×3): qty 1.5

## 2018-01-24 MED ORDER — URELLE 81 MG PO TABS
1.0000 | ORAL_TABLET | ORAL | Status: DC | PRN
  Filled 2018-01-24: qty 1

## 2018-01-24 MED ORDER — CYANOCOBALAMIN 1000 MCG/ML IJ KIT: 1000.0000 mg | PACK | INTRAMUSCULAR | Status: DC

## 2018-01-24 MED ORDER — ACETAMINOPHEN 325 MG PO TABS
650.0000 mg | ORAL_TABLET | Freq: Four times a day (QID) | ORAL | Status: DC | PRN
  Administered 2018-01-26 – 2018-01-27 (×2): 650 mg via ORAL
  Filled 2018-01-24 (×2): qty 2

## 2018-01-24 MED ORDER — SUMATRIPTAN SUCCINATE 50 MG PO TABS: 50.0000 mg | ORAL_TABLET | ORAL | Status: DC | PRN

## 2018-01-24 MED ORDER — ALUM & MAG HYDROXIDE-SIMETH 200-200-20 MG/5ML PO SUSP: 30.0000 mL | ORAL | Status: DC | PRN

## 2018-01-24 MED ORDER — LISDEXAMFETAMINE DIMESYLATE 30 MG PO CAPS: 60.0000 mg | ORAL_CAPSULE | ORAL | Status: DC

## 2018-01-24 MED ORDER — METHOCARBAMOL 500 MG PO TABS
500.0000 mg | ORAL_TABLET | Freq: Three times a day (TID) | ORAL | Status: DC | PRN
  Administered 2018-01-24 – 2018-01-26 (×2): 500 mg via ORAL
  Filled 2018-01-24 (×2): qty 1

## 2018-01-24 MED ORDER — THYROID 60 MG PO TABS
60.0000 mg | ORAL_TABLET | Freq: Every day | ORAL | Status: DC
  Administered 2018-01-24: 60 mg via ORAL
  Filled 2018-01-24: qty 1

## 2018-01-24 MED ORDER — TRAZODONE HCL 50 MG PO TABS
50.0000 mg | ORAL_TABLET | Freq: Every evening | ORAL | Status: DC | PRN
  Administered 2018-01-24: 50 mg via ORAL
  Filled 2018-01-24: qty 1

## 2018-01-24 MED ORDER — DULOXETINE HCL 30 MG PO CPEP
60.0000 mg | ORAL_CAPSULE | Freq: Two times a day (BID) | ORAL | Status: DC
  Filled 2018-01-24 (×2): qty 2

## 2018-01-24 MED ORDER — LOPERAMIDE HCL 2 MG PO CAPS: 2.0000 mg | ORAL_CAPSULE | ORAL | Status: DC | PRN

## 2018-01-24 NOTE — ED Provider Notes (Signed)
[facility] DEPT Provider Note  CSN: [id number] Arrival date & time: 01/24/18 0055 c Chief Complaint(s) Medical Clearance  HPI Alexis Singleton is a 37 y.o. female with extensive past medical history listed below presents to the emergency department with worsening depression over the past several weeks.  Patient reports that due to family deaths and illnesses she has not been able to follow-up with her primary care doctor and psychiatrist to get her medications.  States that she is been rationing her meds.  She is endorsing difficulty sleeping and having mood swings.  She also has anhedonia and states "I would not care if I went to sleep and did not wake up."  She denies any active suicide ideations.  She denies any homicidal ideations or AVH.  Denies any recent fevers or infections.  No chest pain or shortness of breath.  No abdominal pain.  No nausea or vomiting.  HPI  Past Medical History Past Medical History:  Diagnosis Date  . 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 Arthritis   . Breast cancer ([place])   . Chronic pain   . Fibromyalgia   . IC (interstitial cystitis)   . Ovarian cancer ([place])   . Pulmonary embolism ([place])   . Seizures ([place])   . Spinal compression fracture ([place])   . Thyroid cancer ([place])   . Thyroid cancer ([place])   . Uterine cancer [facility])    Patient Active Problem List   Diagnosis Date Noted  . Chronic back pain 02/14/2014  . Chronic neck pain 02/14/2014  . Fibromyalgia affecting multiple sites 02/14/2014  . Other chronic pain 02/14/2014   Home Medication(s) Prior to Admission medications   Medication Sig Start Date End Date Taking? Authorizing Provider  busPIRone (BUSPAR) 15 MG tablet Take 15 mg by mouth 2 (two) times daily.   Yes [provider]  Cyanocobalamin 1000 MCG/ML KIT Inject 1,000 mg as directed every 30 (thirty) days.   Yes [provider]  DULoxetine (CYMBALTA) 60 MG capsule Take 60 mg by mouth 2 (two)  times daily.    Yes [provider]  fentaNYL (DURAGESIC - DOSED MCG/HR) 50 MCG/HR Place 50 mcg onto the skin every 3 (three) days. 01/20/18  Yes [provider]  ferrous sulfate 325 (65 FE) MG tablet Take 1 tablet (325 mg total) by mouth 2 (two) times daily with a meal. 06/19/16  Yes Carlisle Cater, PA-C  lidocaine (XYLOCAINE) 1 % (with preservative) injection 2 mLs by Other route as needed (for bladder).   Yes [provider]  lisdexamfetamine (VYVANSE) 60 MG capsule Take 60 mg by mouth every morning.   Yes [provider]  Meth-Hyo-M Bl-Na Phos-Ph Sal (URIBEL) 118 MG CAPS Take 118 mg by mouth as needed (for bladder).  10/02/14  Yes [provider]  Multiple Vitamin (MULTIVITAMIN) capsule Take 1 capsule by mouth daily.    Yes [provider]  ondansetron (ZOFRAN-ODT) 8 MG disintegrating tablet Take 8 mg by mouth every 8 (eight) hours as needed for nausea or vomiting.   Yes [provider]  oxyCODONE (OXY IR/ROXICODONE) 5 MG immediate release tablet Take 5 mg by mouth every 6 (six) hours as needed for severe pain.    Yes [provider]  pantoprazole (PROTONIX) 20 MG tablet Take 1 tablet (20 mg total) by mouth daily. Patient taking differently: Take 40 mg by mouth daily.  07/29/13  Yes [name], PA-C  pentosan polysulfate (ELMIRON) 100 MG capsule Place 100 mg into  the urethra as needed (for bladder).    Yes [provider]  sodium bicarbonate 8.4% (1 mEq/mL) SOLN infusion Place 1 mEq/kg/day into the urethra as directed.    Yes [provider]  solifenacin (VESICARE) 10 MG tablet Take 10 mg by mouth daily.    Yes [provider]  SUMAtriptan (IMITREX) 6 MG/0.5ML SOLN injection Inject 6 mg into the skin every 2 (two) hours as needed for migraine or headache. May repeat in 2 hours if headache persists or recurs.   Yes [provider]  thyroid (ARMOUR) 60 MG tablet Take 60 mg by mouth daily  before breakfast.   Yes [provider]  tiZANidine (ZANAFLEX) 4 MG capsule Take 4 mg by mouth 4 (four) times daily.    Yes [provider]  traZODone (DESYREL) 50 MG tablet Take 100 mg by mouth at bedtime.   Yes [provider]                                                                                                                                    Past Surgical History Past Surgical History:  Procedure Laterality Date  . ABDOMINAL EXPLORATION SURGERY    . ABDOMINAL HYSTERECTOMY    . APPENDECTOMY    . BREAST LUMPECTOMY    . CHOLECYSTECTOMY    . COMBINED AUGMENTATION MAMMAPLASTY AND ABDOMINOPLASTY    . GASTRIC BYPASS    . I&D EXTREMITY Left 01/18/2015   Procedure: IRRIGATION AND DEBRIDEMENT, PINNING  AND  REPAIR NAIL BED LEFT MIDDLE FINGER;  Surgeon: Leanora Cover, MD;  Location: [facility];  Service: Orthopedics;  Laterality: Left;  . SINUSOTOMY    . THYROIDECTOMY     Family History Family History  Problem Relation Age of Onset  . Alcohol abuse Father     Social History Social History   Tobacco Use  . Smoking status: Never Smoker  Substance Use Topics  . Alcohol use: Yes    Comment: occassionally  . Drug use: No   Allergies Ioxaglate; Ivp dye [iodinated diagnostic agents]; Metrizamide; Other; Tramadol; Carbamazepine; Buprenorphine; Divalproex sodium; Levetiracetam; Nsaids; Morphine; Topiramate; and Valproic acid  Review of Systems Review of Systems All other systems are reviewed and are negative for acute change except as noted in the HPI  Physical Exam Vital Signs  I have reviewed the triage vital signs BP 136/67 (BP Location: Left Arm)   Pulse (!) 103   Temp 99.1 F (37.3 C) (Oral)   Resp 20   Ht _0  (1.676 m)   Wt 56.7 kg (125 lb)   SpO2 100%   BMI 20.18 kg/m   Physical Exam  Constitutional: She is oriented to person, place, and time. She appears well-developed and well-nourished. No distress.  HENT:  Head: Normocephalic and  atraumatic.  Nose: Nose normal.  Eyes: Pupils are equal, round, and reactive to light. Conjunctivae and EOM are normal. Right eye exhibits no  discharge. Left eye exhibits no discharge. No scleral icterus.  Neck: Normal range of motion. Neck supple.  Cardiovascular: Normal rate and regular rhythm. Exam reveals no gallop and no friction rub.  No murmur heard. Pulmonary/Chest: Effort normal and breath sounds normal. No stridor. No respiratory distress. She has no rales.  Abdominal: Soft. She exhibits no distension. There is no tenderness.  Musculoskeletal: She exhibits no edema or tenderness.  Neurological: She is alert and oriented to person, place, and time.  Skin: Skin is warm and dry. No rash noted. She is not diaphoretic. No erythema.  Psychiatric: Her speech is normal. Her affect is labile.  Vitals reviewed.   ED Results and Treatments Labs (all labs ordered are listed, but only abnormal results are displayed) Labs Reviewed  COMPREHENSIVE METABOLIC PANEL - Abnormal; Notable for the following components:      Result Value   Glucose, Bld 112 (*)    Calcium 8.7 (*)    All other components within normal limits  RAPID URINE DRUG SCREEN, HOSP PERFORMED - Abnormal; Notable for the following components:   Tetrahydrocannabinol POSITIVE (*)    Barbiturates   (*)    Value: Result not available. Reagent lot number recalled by manufacturer.   All other components within normal limits  CBC WITH DIFFERENTIAL/PLATELET - Abnormal; Notable for the following components:   Hemoglobin 11.5 (*)    HCT 35.2 (*)    All other components within normal limits  ACETAMINOPHEN LEVEL - Abnormal; Notable for the following components:   Acetaminophen (Tylenol), Serum <10 (*)    All other components within normal limits  ETHANOL  SALICYLATE LEVEL  URINALYSIS, ROUTINE W REFLEX MICROSCOPIC  HCG, QUANTITATIVE, PREGNANCY  TSH  T4, FREE  I-STAT BETA HCG BLOOD, ED ([facility], [facility], [facility] ONLY)  I-STAT BETA HCG BLOOD, ED  ([facility], [facility], [facility] ONLY)                                                                                                                         EKG  EKG Interpretation  Date/Time:  Tuesday January 24 2018 02:17:55 EDT Ventricular Rate:  77 PR Interval:    QRS Duration: 91 QT Interval:  362 QTC Calculation: 410 R Axis:   81 Text Interpretation:  Sinus rhythm Borderline short PR interval ST elev, probable normal early repol pattern No significant change since last tracing Confirmed by Addison Lank [phone]) on 01/24/2018 5:04:46 AM      Radiology No results found. Pertinent labs & imaging results that were available during my care of the patient were reviewed by me and considered in my medical decision making (see chart for details).  Medications Ordered in ED Medications  busPIRone (BUSPAR) tablet 15 mg (has no administration in time range)  DULoxetine (CYMBALTA) DR capsule 60 mg (has no administration in time range)  lisdexamfetamine (VYVANSE) capsule 60 mg (has no administration in time range)  URELLE (URELLE/URISED) 81 MG tablet 81 mg (has no administration in time range)  ondansetron (ZOFRAN-ODT) disintegrating tablet 8 mg (has no administration in time range)  oxyCODONE (Oxy IR/ROXICODONE) immediate release tablet 5 mg (has no administration in time range)  pantoprazole (PROTONIX) EC tablet 40 mg (has no administration in time range)  pentosan polysulfate (ELMIRON) capsule 100 mg (has no administration in time range)  thyroid (ARMOUR) tablet 60 mg (has no administration in time range)  tiZANidine (ZANAFLEX) tablet 4 mg (has no administration in time range)  traZODone (DESYREL) tablet 100 mg (has no administration in time range)                                                                                                                                    Procedures Procedures  (including critical care time)  Medical Decision Making / ED Course I have reviewed the nursing notes for  this encounter and the patient's prior records (if available in EHR or on provided paperwork).    Worsening depression.  No active SI.  No HI or AVH.  Due to patient's multiple comorbidities, screening labs were obtained and grossly reassuring.  She is medically cleared for behavioral health evaluation and management.  Final Clinical Impression(s) / ED Diagnoses Final diagnoses:  Depression, unspecified depression type      This chart was dictated using voice recognition software.  Despite best efforts to proofread,  errors can occur which can change the documentation meaning.   Fatima Blank, MD 01/24/18 [phone]

## 2018-01-24 NOTE — ED Notes (Signed)
TTS at bedside. 

## 2018-01-24 NOTE — Progress Notes (Signed)
Attended group 

## 2018-01-24 NOTE — BH Assessment (Signed)
[facility] Assessment Progress Note  Per Buford Dresser, DO, this pt requires psychiatric hospitalization at this time.  Letitia Libra, RN, [facility] has assigned pt to [facility] Rm [id number]; [facility] will be ready to receive pt at 14:00.  Pt has signed Voluntary Admission and Consent for Treatment, as well as Consent to Release Information to no one, and signed forms have been faxed to [facility].  Pt's nurse, Caryl Pina, has been notified, and agrees to send original paperwork along with pt via [facility], and to call report to [phone].  Jalene Mullet, [place] Coordinator [phone]

## 2018-01-24 NOTE — Progress Notes (Signed)
Alexis Singleton is a 37 year old female pt admitted on voluntary basis. On admission, Alexis Singleton presents as fidgety and anxious and reports on-going depression and anxiety and spoke about how she has had losses in her life recently and also spoke about some family members who are "terminal". She also reports various medical issues that contribute to her depression. She denies any SI on admission and is able to contract for safety while on the unit. She reports she takes her medications as prescribed but reports that she has medicaid and does not have a PCP at the current moment and reports that her neurologist gives most of her medicine. She does endorse some marijuana usage but denies any other substance abuse issues. Alexis Singleton was oriented to the unit and safety maintained.

## 2018-01-24 NOTE — ED Notes (Signed)
Report given to [facility], [place]

## 2018-01-24 NOTE — H&P (Signed)
Psychiatric Admission Assessment Adult  Patient Identification: Alexis Singleton MRN:  [id number] Date of Evaluation:  01/24/2018 Chief Complaint:  MDD Principal Diagnosis: <principal problem not specified> Diagnosis:   Patient Active Problem List   Diagnosis Date Noted  . Major depressive disorder, recurrent severe without psychotic features ([place]) [F33.2] 01/24/2018  . GAD (generalized anxiety disorder) [F41.1] 01/24/2018  . Chronic back pain [M54.9, G89.29] 02/14/2014  . Chronic neck pain [M54.2, G89.29] 02/14/2014  . Fibromyalgia affecting multiple sites [M79.7] 02/14/2014  . Other chronic pain [G89.29] 02/14/2014   History of Present Illness: Patient is seen and examined.  Patient is a 37 year old female with multiple past medical problems as well as worsening depression and anxiety over the last several weeks.  She has been treated for chronic pain with opiates for several years.  The patient stated that she has been recently caretaking to 1 of her family members who had recently passed.  She is also been caretaking another family member who had recently died.  She found out that her father was apparently dying.  She originally presented to the emergency department and stated that she had run out of several of her medications, and wanted to get back on her depression medications.  When she got to the hospital she admitted that she had been taking her Cymbalta 60 mg p.o. twice daily but had not been taking several other medications including BuSpar.  She was significantly physically agitated and fidgeting.  During the course of the interview it became clear that there was some withdrawal symptoms.  She stated that she had been on fentanyl as well as oxycodone for several years.  [place] her patches "disappeared".  She stated she had not had her opiates for 6 days.  Of note, her drug screen today was negative for opiates.  She filled her  prescription for fentanyl patches on 6/29.  She had filled her oxycodone prescription shortly prior to this.  She stated 1 of her concerns and coming in today was that she wanted to get off the opiates because 1 of her family members had died of complications of liver disease.  She was afraid of what these medicines were doing to her liver.  She had been discharged from her psychiatrist back in 2018.  She also stated she had not been on her thyroid medicine for several months.  Again of note, her TSH in the emergency room was normal.  She is at times tangential and agitated.  We discussed treatment for opiate withdrawal, and reaffirmed with her her desire to come off the opiates.  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  She was admitted to the hospital for evaluation and stabilization. Associated Signs/Symptoms: Depression Symptoms:  depressed mood, anhedonia, insomnia, psychomotor agitation, fatigue, feelings of worthlessness/guilt, difficulty concentrating, hopelessness, anxiety, panic attacks, loss of energy/fatigue, disturbed sleep, weight loss, (Hypo) Manic Symptoms:  Impulsivity, Irritable Mood, Anxiety Symptoms:  Excessive Worry, Psychotic Symptoms:  Denied PTSD Symptoms: Negative Total Time spent with patient: 1 hour  Past Psychiatric History: Patient had been followed by psychiatry in the [facility] health system.  This office was in [facility].  She was apparently discharged from that clinic after missing four appointments.  She denied any previous psychiatric hospitalizations.  Is the patient at risk to self? No.  Has the patient been  a risk to self in the past 6 months? No.  Has the patient been a risk to self within the distant past? No.  Is the patient a risk to others? No.  Has the patient been a risk to others  in the past 6 months? No.  Has the patient been a risk to others within the distant past? No.   Prior Inpatient Therapy:   Prior Outpatient Therapy:    Alcohol Screening: 1. How often do you have a drink containing alcohol?: Monthly or less 2. How many drinks containing alcohol do you have on a typical day when you are drinking?: 1 or 2 3. How often do you have six or more drinks on one occasion?: Never AUDIT-C Score: 1 4. How often during the last year have you found that you were not able to stop drinking once you had started?: Never 5. How often during the last year have you failed to do what was normally expected from you becasue of drinking?: Never 6. How often during the last year have you needed a first drink in the morning to get yourself going after a heavy drinking session?: Never 7. How often during the last year have you had a feeling of guilt of remorse after drinking?: Never 8. How often during the last year have you been unable to remember what happened the night before because you had been drinking?: Never 9. Have you or someone else been injured as a result of your drinking?: No 10. Has a relative or friend or a doctor or another health worker been concerned about your drinking or suggested you cut down?: No Alcohol Use Disorder Identification Test Final Score (AUDIT): 1 Intervention/Follow-up: AUDIT Score <7 follow-up not indicated Substance Abuse History in the last 12 months:  Yes.   Consequences of Substance Abuse: She stated she had started using marijuana recently for her chronic pain issues, and insomnia. Previous Psychotropic Medications: Yes  Psychological Evaluations: Yes  Past Medical History:  Past Medical History:  Diagnosis Date  . 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 Arthritis   . Breast cancer ([place])   . Chronic pain   . Fibromyalgia   . IC (interstitial cystitis)   . Ovarian cancer ([place])   . Pulmonary embolism ([place])   . Seizures ([place])   . Spinal  compression fracture ([place])   . Thyroid cancer ([place])   . Thyroid cancer ([place])   . Uterine cancer [facility])     Past Surgical History:  Procedure Laterality Date  . ABDOMINAL EXPLORATION SURGERY    . ABDOMINAL HYSTERECTOMY    . APPENDECTOMY    . BREAST LUMPECTOMY    . CHOLECYSTECTOMY    . COMBINED AUGMENTATION MAMMAPLASTY AND ABDOMINOPLASTY    . GASTRIC BYPASS    . I&D EXTREMITY Left 01/18/2015   Procedure: IRRIGATION AND DEBRIDEMENT, PINNING  AND  REPAIR NAIL BED LEFT MIDDLE FINGER;  Surgeon: Leanora Cover, MD;  Location: [facility];  Service: Orthopedics;  Laterality: Left;  . SINUSOTOMY    . THYROIDECTOMY     Family History:  Family History  Problem Relation Age of Onset  . Alcohol abuse Father    Family Psychiatric  History: She stated her mother had depression. Tobacco Screening: Have you used any form of tobacco in the last 30 days? (Cigarettes, Smokeless Tobacco, Cigars, and/or Pipes): No Social History:  Social History   Substance and Sexual Activity  Alcohol Use Yes   Comment: occassionally     Social History  Substance and Sexual Activity  Drug Use Yes  . Types: Marijuana    Additional Social History:                           Allergies:   Allergies  Allergen Reactions  . Ioxaglate Anaphylaxis  . Ivp Dye [Iodinated Diagnostic Agents] Anaphylaxis  . Metrizamide Anaphylaxis  . Other Anaphylaxis    Seafood   . Tramadol Other (See Comments)    Seizures  . Carbamazepine Other (See Comments)    Headache  . Buprenorphine Nausea Only  . Divalproex Sodium Other (See Comments)    Increased seizure activity  . Levetiracetam Other (See Comments)    Increased seizure activity  . Nsaids Other (See Comments)    Gastric bypass patient  . Morphine Itching  . Topiramate Nausea Only  . Valproic Acid Nausea Only   Lab Results:  Results for orders placed or performed during the hospital encounter of 01/24/18 (from the past 48 hour(s))  TSH     Status: None    Collection Time: 01/24/18  2:57 AM  Result Value Ref Range   TSH 3.006 0.350 - 4.500 uIU/mL    Comment: Performed by a 3rd Generation assay with a functional sensitivity of <=0.01 uIU/mL. Performed at [facility], [place] [address] [place], [place] [address]   T4, free     Status: None   Collection Time: 01/24/18  2:57 AM  Result Value Ref Range   Free T4 0.84 0.82 - 1.77 ng/dL    Comment: (NOTE) Biotin ingestion may interfere with free T4 tests. If the results are inconsistent with the TSH level, previous test results, or the clinical presentation, then consider biotin interference. If needed, order repeat testing after stopping biotin. Performed at [facility], [place] [address], [place], [place] [address]   Urine rapid drug screen (hosp performed)     Status: Abnormal   Collection Time: 01/24/18  2:58 AM  Result Value Ref Range   Opiates NONE DETECTED NONE DETECTED   Cocaine NONE DETECTED NONE DETECTED   Benzodiazepines NONE DETECTED NONE DETECTED   Amphetamines NONE DETECTED NONE DETECTED   Tetrahydrocannabinol POSITIVE (A) NONE DETECTED   Barbiturates (A) NONE DETECTED    Result not available. Reagent lot number recalled by manufacturer.    Comment: Performed at [facility], [place] [address] [place], [place] [address]  Urinalysis, Routine w reflex microscopic     Status: None   Collection Time: 01/24/18  2:58 AM  Result Value Ref Range   Color, Urine YELLOW YELLOW   APPearance CLEAR CLEAR   Specific Gravity, Urine 1.021 1.005 - 1.030   pH 6.0 5.0 - 8.0   Glucose, UA NEGATIVE NEGATIVE mg/dL   Hgb urine dipstick NEGATIVE NEGATIVE   Bilirubin Urine NEGATIVE NEGATIVE   Ketones, ur NEGATIVE NEGATIVE mg/dL   Protein, ur NEGATIVE NEGATIVE mg/dL   Nitrite NEGATIVE NEGATIVE   Leukocytes, UA NEGATIVE NEGATIVE    Comment: Performed at [place] [address], [place], [place] [address]  I-Stat beta hCG blood, ED      Status: None   Collection Time: 01/24/18  3:03 AM  Result Value Ref Range   I-stat hCG, quantitative <5.0 <5 mIU/mL   Comment 3            Comment:   GEST. AGE      CONC.  (mIU/mL)   <=1 WEEK  5 - 50     2 WEEKS       50 - 500     3 WEEKS       100 - 10,000     4 WEEKS     1,000 - 30,000        FEMALE AND NON-PREGNANT FEMALE:     LESS THAN 5 mIU/mL   Comprehensive metabolic panel     Status: Abnormal   Collection Time: 01/24/18  3:44 AM  Result Value Ref Range   Sodium 137 135 - 145 mmol/L   Potassium 3.7 3.5 - 5.1 mmol/L   Chloride 106 98 - 111 mmol/L    Comment: Please note change in reference range.   CO2 22 22 - 32 mmol/L   Glucose, Bld 112 (H) 70 - 99 mg/dL    Comment: Please note change in reference range.   BUN 13 6 - 20 mg/dL    Comment: Please note change in reference range.   Creatinine, Ser 0.50 0.44 - 1.00 mg/dL   Calcium 8.7 (L) 8.9 - 10.3 mg/dL   Total Protein 7.0 6.5 - 8.1 g/dL   Albumin 3.8 3.5 - 5.0 g/dL   AST 18 15 - 41 U/L   ALT 17 0 - 44 U/L    Comment: Please note change in reference range.   Alkaline Phosphatase 66 38 - 126 U/L   Total Bilirubin 0.7 0.3 - 1.2 mg/dL   GFR calc non Af Amer >60 >60 mL/min   GFR calc Af Amer >60 >60 mL/min    Comment: (NOTE) The eGFR has been calculated using the CKD EPI equation. This calculation has not been validated in all clinical situations. eGFR's persistently <60 mL/min signify possible Chronic Kidney Disease.    Anion gap 9 5 - 15    Comment: Performed at [facility], [place] [address] [place], [place] [address]  Ethanol     Status: None   Collection Time: 01/24/18  3:44 AM  Result Value Ref Range   Alcohol, Ethyl (B) <10 <10 mg/dL    Comment: Performed at [facility], [place] [address], [place], [place] [address]  CBC with Diff     Status: Abnormal   Collection Time: 01/24/18  3:44 AM  Result Value Ref Range   WBC 7.7 4.0 - 10.5 K/uL   RBC 4.04 3.87 - 5.11 MIL/uL    Hemoglobin 11.5 (L) 12.0 - 15.0 g/dL   HCT 35.2 (L) 36.0 - 46.0 %   MCV 87.1 78.0 - 100.0 fL   MCH 28.5 26.0 - 34.0 pg   MCHC 32.7 30.0 - 36.0 g/dL   RDW 13.3 11.5 - 15.5 %   Platelets 286 150 - 400 K/uL   Neutrophils Relative % 87 %   Neutro Abs 6.6 1.7 - 7.7 K/uL   Lymphocytes Relative 9 %   Lymphs Abs 0.7 0.7 - 4.0 K/uL   Monocytes Relative 3 %   Monocytes Absolute 0.3 0.1 - 1.0 K/uL   Eosinophils Relative 1 %   Eosinophils Absolute 0.1 0.0 - 0.7 K/uL   Basophils Relative 0 %   Basophils Absolute 0.0 0.0 - 0.1 K/uL    Comment: Performed at [facility], [place] [address], [place], [place] [address]  Salicylate level     Status: None   Collection Time: 01/24/18  3:44 AM  Result Value Ref Range   Salicylate Lvl <3.5 2.8 - 30.0 mg/dL    Comment: Performed  at [facility], [place] [address] [place], [place] [address]  Acetaminophen level     Status: Abnormal   Collection Time: 01/24/18  3:44 AM  Result Value Ref Range   Acetaminophen (Tylenol), Serum <10 (L) 10 - 30 ug/mL    Comment: Performed at [facility], [place] [address], [place], [place] [address]  hCG, quantitative, pregnancy     Status: None   Collection Time: 01/24/18  3:44 AM  Result Value Ref Range   hCG, Beta Chain, Quant, S <1 <5 mIU/mL    Comment:          GEST. AGE      CONC.  (mIU/mL)   <=1 WEEK        5 - 50     2 WEEKS       50 - 500     3 WEEKS       100 - 10,000     4 WEEKS     1,000 - 30,000     5 WEEKS     3,500 - 115,000   6-8 WEEKS     12,000 - 270,000    12 WEEKS     15,000 - 220,000        FEMALE AND NON-PREGNANT FEMALE:     LESS THAN 5 mIU/mL Performed at [facility], [place] [address], [place], [place] [address]     Blood Alcohol level:  Lab Results  Component Value Date   ETH <10 68/37/2902    Metabolic Disorder Labs:  No results found for: HGBA1C, MPG No results found for: PROLACTIN No results found for: CHOL,  TRIG, HDL, CHOLHDL, VLDL, LDLCALC  Current Medications: Current Facility-Administered Medications  Medication Dose Route Frequency Provider Last Rate Last Dose  . acetaminophen (TYLENOL) tablet 650 mg  650 mg Oral Q6H PRN Sharma Covert, MD      . alum & mag hydroxide-simeth (MAALOX/MYLANTA) 200-200-20 MG/5ML suspension 30 mL  30 mL Oral Q4H PRN Sharma Covert, MD      . busPIRone (BUSPAR) tablet 5 mg  5 mg Oral TID Sharma Covert, MD   5 mg at 01/24/18 1725  . cholecalciferol (VITAMIN D) tablet 400 Units  400 Units Oral Daily Sharma Covert, MD      . darifenacin (ENABLEX) 24 hr tablet 15 mg  15 mg Oral Daily Sharma Covert, MD      . dicyclomine (BENTYL) tablet 20 mg  20 mg Oral Q6H PRN Sharma Covert, MD      . DULoxetine (CYMBALTA) DR capsule 60 mg  60 mg Oral BID Sharma Covert, MD   60 mg at 01/24/18 1725  . folic acid (FOLVITE) tablet 1 mg  1 mg Oral Daily Sharma Covert, MD   1 mg at 01/24/18 1725  . hydrOXYzine (ATARAX/VISTARIL) tablet 25 mg  25 mg Oral Q6H PRN Sharma Covert, MD      . loperamide (IMODIUM) capsule 2-4 mg  2-4 mg Oral PRN Sharma Covert, MD      . magnesium hydroxide (MILK OF MAGNESIA) suspension 30 mL  30 mL Oral Daily PRN Sharma Covert, MD      . methocarbamol (ROBAXIN) tablet 500 mg  500 mg Oral Q8H PRN Sharma Covert, MD      . Derrill Memo ON 01/25/2018] multivitamin (PROSIGHT) tablet 1 tablet  1 tablet Oral Daily Sharma Covert, MD      .  ondansetron (ZOFRAN-ODT) disintegrating tablet 4 mg  4 mg Oral Q6H PRN Sharma Covert, MD   4 mg at 01/24/18 1725  . pantoprazole (PROTONIX) EC tablet 40 mg  40 mg Oral Daily Sharma Covert, MD   40 mg at 01/24/18 1725  . SUMAtriptan (IMITREX) tablet 50 mg  50 mg Oral Q2H PRN Sharma Covert, MD      . tiZANidine (ZANAFLEX) tablet 4 mg  4 mg Oral TID Sharma Covert, MD   4 mg at 01/24/18 1725  . traZODone (DESYREL) tablet 50 mg  50 mg Oral QHS PRN Sharma Covert, MD       . trimethoprim (TRIMPEX) tablet 100 mg  100 mg Oral Daily Sharma Covert, MD       PTA Medications: Medications Prior to Admission  Medication Sig Dispense Refill Last Dose  . busPIRone (BUSPAR) 15 MG tablet Take 15 mg by mouth 2 (two) times daily.   01/23/2018 at Unknown time  . Cyanocobalamin 1000 MCG/ML KIT Inject 1,000 mg as directed every 30 (thirty) days.   Past Month at Unknown time  . DULoxetine (CYMBALTA) 60 MG capsule Take 60 mg by mouth 2 (two) times daily.    01/23/2018 at Unknown time  . ferrous sulfate 325 (65 FE) MG tablet Take 1 tablet (325 mg total) by mouth 2 (two) times daily with a meal. 60 tablet 1 Past Month at Unknown time  . lidocaine (XYLOCAINE) 1 % (with preservative) injection 2 mLs by Other route as needed (for bladder).   Past Week at Unknown time  . Meth-Hyo-M Bl-Na Phos-Ph Sal (URIBEL) 118 MG CAPS Take 118 mg by mouth as needed (for bladder).    Over a month ago  . Multiple Vitamin (MULTIVITAMIN) capsule Take 1 capsule by mouth daily.    01/23/2018 at Unknown time  . ondansetron (ZOFRAN-ODT) 8 MG disintegrating tablet Take 8 mg by mouth every 8 (eight) hours as needed for nausea or vomiting.   01/23/2018 at Unknown time  . pantoprazole (PROTONIX) 20 MG tablet Take 1 tablet (20 mg total) by mouth daily. (Patient taking differently: Take 40 mg by mouth daily. ) 30 tablet 0 Past Month at Unknown time  . pentosan polysulfate (ELMIRON) 100 MG capsule Place 100 mg into the urethra as needed (for bladder).    Past Week at Unknown time  . sodium bicarbonate 8.4% (1 mEq/mL) SOLN infusion Place 1 mEq/kg/day into the urethra as directed.    Past Week at Unknown time  . solifenacin (VESICARE) 10 MG tablet Take 10 mg by mouth daily.    01/23/2018 at Unknown time  . SUMAtriptan (IMITREX) 6 MG/0.5ML SOLN injection Inject 6 mg into the skin every 2 (two) hours as needed for migraine or headache. May repeat in 2 hours if headache persists or recurs.   01/23/2018 at Unknown time  .  thyroid (ARMOUR) 60 MG tablet Take 60 mg by mouth daily before breakfast.   Over a month ago at Unknown time  . tiZANidine (ZANAFLEX) 4 MG capsule Take 4 mg by mouth 4 (four) times daily.    01/23/2018 at Unknown time  . traZODone (DESYREL) 50 MG tablet Take 100 mg by mouth at bedtime.   Past Week at Unknown time    Musculoskeletal: Strength & Muscle Tone: within normal limits Gait & Station: normal Patient leans: N/A  Psychiatric Specialty Exam: Physical Exam  Nursing note and vitals reviewed. Constitutional: She is oriented to person, place, and time. She  appears well-developed and well-nourished.  HENT:  Head: Atraumatic.  Respiratory: Effort normal.  Neurological: She is alert and oriented to person, place, and time.    ROS  Blood pressure (!) 138/98, pulse (!) 108, temperature 100.3 F (37.9 C), temperature source Oral, resp. rate 18, height _0  (1.676 m), weight 56.7 kg (125 lb).Body mass index is 20.18 kg/m.  General Appearance: Disheveled  Eye Contact:  Fair  Speech:  Pressured  Volume:  Increased  Mood:  Anxious, Depressed and Dysphoric  Affect:  Congruent  Thought Process:  Disorganized  Orientation:  Full (Time, Place, and Person)  Thought Content:  Tangential  Suicidal Thoughts:  No  Homicidal Thoughts:  No  Memory:  Immediate;   Fair Recent;   Fair Remote;   Fair  Judgement:  Impaired  Insight:  Lacking  Psychomotor Activity:  Restlessness  Concentration:  Concentration: Poor and Attention Span: Poor  Recall:  Poor  Fund of Knowledge:  Fair  Language:  Fair  Akathisia:  Negative  Handed:  Right  AIMS (if indicated):     Assets:  Desire for Improvement  ADL's:  Intact  Cognition:  WNL  Sleep:       Treatment Plan Summary: Daily contact with patient to assess and evaluate symptoms and progress in treatment, Medication management and Plan Patient is seen and examined.  Patient is a 37 year old female with a reported past psychiatric history significant  for depression, anxiety, and probable opiate dependence from long-term use of fentanyl as well as other opiates.  It is quite confusing and unclear what is letter to desire to stop taking these medications, but apparently her fentanyl patches were "missing" from the place where she had been living with 1 of her family members who was passing away.  She did have her fentanyl patches filled on 6/29 and her oxycodone was filled earlier that week.  Her drug screen for opiates was negative today, and positive for marijuana.  She stated that she want to come off her medications because 1 of the things that led to the death of her family members was a liver problem.  She is worried about all these medications and what their effects may be on her liver.  She has not seen a psychiatrist in 6 months or more.  It is odd that she had her antidepressant medications continued, but not her BuSpar.  She also stated she is gone without thyroid for 6 months but her TSH is within the normal limits.  We will have to get in contact with some of her primary doctors to get a better idea of what is really going on.  Week will continue some of these medications.  She also has a reported history of interstitial cystitis, fibromyalgia, migraine headaches, seizures as well as anemia.  Given her TSH being normal I am going to hold off on her thyroid medications.  We will place her in the opiate detox protocol.  She is in agreement with that.  Hopefully that will go smoothly.  I will place her on seizure precautions, but I suspect these are more nonepileptic seizures.  Her neurology notes mention nothing about a seizure disorder, and mainly concentrate on her migraine headache treatment.  Observation Level/Precautions:  Detox 15 minute checks Seizure  Laboratory:  Chemistry Profile  Psychotherapy:    Medications:    Consultations:    Discharge Concerns:    Estimated LOS:  Other:     Physician Treatment Plan for Primary Diagnosis:  <  principal problem not specified> Long Term Goal(s): Improvement in symptoms so as ready for discharge  Short Term Goals: Ability to identify changes in lifestyle to reduce recurrence of condition will improve, Ability to verbalize feelings will improve, Ability to disclose and discuss suicidal ideas, Ability to demonstrate self-control will improve, Ability to identify and develop effective coping behaviors will improve, Ability to maintain clinical measurements within normal limits will improve, Compliance with prescribed medications will improve and Ability to identify triggers associated with substance abuse/mental health issues will improve  Physician Treatment Plan for Secondary Diagnosis: Active Problems:   GAD (generalized anxiety disorder)  Long Term Goal(s): Improvement in symptoms so as ready for discharge  Short Term Goals: Ability to identify changes in lifestyle to reduce recurrence of condition will improve, Ability to verbalize feelings will improve, Ability to disclose and discuss suicidal ideas, Ability to demonstrate self-control will improve, Ability to identify and develop effective coping behaviors will improve, Ability to maintain clinical measurements within normal limits will improve, Compliance with prescribed medications will improve and Ability to identify triggers associated with substance abuse/mental health issues will improve  I certify that inpatient services furnished can reasonably be expected to improve the patient's condition.    Sharma Covert, MD 7/2/20195:33 PM

## 2018-01-24 NOTE — BHH Suicide Risk Assessment (Signed)
[facility] Admission Suicide Risk Assessment   Nursing information obtained from:    Demographic factors:    Current Mental Status:    Loss Factors:    Historical Factors:    Risk Reduction Factors:     Total Time spent with patient: 45 minutes Principal Problem: <principal problem not specified> Diagnosis:   Patient Active Problem List   Diagnosis Date Noted  . Major depressive disorder, recurrent severe without psychotic features ([place]) [F33.2] 01/24/2018  . GAD (generalized anxiety disorder) [F41.1] 01/24/2018  . Chronic back pain [M54.9, G89.29] 02/14/2014  . Chronic neck pain [M54.2, G89.29] 02/14/2014  . Fibromyalgia affecting multiple sites [M79.7] 02/14/2014  . Other chronic pain [G89.29] 02/14/2014   Subjective Data: Patient is seen and examined.  Patient is a 37 year old female with multiple past medical problems as well as worsening depression and anxiety over the last several weeks.  The patient stated that she had been recently caretaking to her family members who had passed, and is well recently found out that her father was dying.  She stated she had missed multiple appointments with her primary care doctor as well as her psychiatrist.  Review of the medical records show that she had been discharged from her psychiatrist in 2018.  She reportedly had not been receiving all of her medication but stated to me that she had been taking her Cymbalta 60 mg p.o. twice daily.  She stated her anxiety was worsening.  She also stated [place].  She had gone without them for 6 days.  She had filled a prescription for them on 6/29.  She is very confusing at this point with regard to her opiate use.  Her drug screen was negative for opiates despite the fact of having taken fentanyl as well as oxycodone.  She is very at times tangential.  After further history she is decided that she wants to come off her opiates,  and that was the reason why she was seeking admission.  She denied any suicidal ideation she also stated that she had been off her thyroid medication for an extensive period of time, but her TSH was within normal limits.  She stated that she is also status post gastric bypass surgery, and has not been taking her vitamins regularly.  Her MCV was normal.  She was admitted to the hospital for evaluation and stabilization.  Continued Clinical Symptoms:    The "Alcohol Use Disorders Identification Test", Guidelines for Use in Primary Care, Second Edition.  World Pharmacologist [facility]). Score between 0-7:  no or low risk or alcohol related problems. Score between 8-15:  moderate risk of alcohol related problems. Score between 16-19:  high risk of alcohol related problems. Score 20 or above:  warrants further diagnostic evaluation for alcohol dependence and treatment.   CLINICAL FACTORS:   Severe Anxiety and/or Agitation Panic Attacks Depression:   Anhedonia Hopelessness Impulsivity Insomnia Chronic Pain More than one psychiatric diagnosis Previous Psychiatric Diagnoses and Treatments Medical Diagnoses and Treatments/Surgeries   Musculoskeletal: Strength & Muscle Tone: within normal limits Gait & Station: normal Patient leans: N/A  Psychiatric Specialty Exam: Physical Exam  Nursing note and vitals reviewed. Constitutional: She is oriented to person, place, and time. She appears well-developed and well-nourished.  HENT:  Head: Normocephalic and atraumatic.  Respiratory: Effort normal.  Neurological: She is alert and oriented to person, place, and time.    ROS  There  were no vitals taken for this visit.There is no height or weight on file to calculate BMI.  General Appearance: Casual  Eye Contact:  Fair  Speech:  Pressured  Volume:  Increased  Mood:  Anxious  Affect:  Congruent  Thought Process:  Disorganized  Orientation:  Full (Time, Place, and Person)  Thought Content:   Logical  Suicidal Thoughts:  No  Homicidal Thoughts:  No  Memory:  Immediate;   Poor Recent;   Poor Remote;   Poor  Judgement:  Impaired  Insight:  Lacking  Psychomotor Activity:  Restlessness  Concentration:  Concentration: Poor and Attention Span: Poor  Recall:  Poor  Fund of Knowledge:  Fair  Language:  Good  Akathisia:  Negative  Handed:  Right  AIMS (if indicated):     Assets:  Desire for Improvement Housing Resilience  ADL's:  Intact  Cognition:  WNL  Sleep:         COGNITIVE FEATURES THAT CONTRIBUTE TO RISK:  None    SUICIDE RISK:   Minimal: No identifiable suicidal ideation.  Patients presenting with no risk factors but with morbid ruminations; may be classified as minimal risk based on the severity of the depressive symptoms  PLAN OF CARE: Patient is seen and examined.  Patient is a 37 year old female with a reported past psychiatric history significant for depression and anxiety, and probable opiate dependence from long-term use of fentanyl as well as other opiates.  It is unclear what is led to her desire to stop her medications, but her fentanyl patches were missing from the place where she was living with 1 of her family members who was passing away.  She did have her fentanyl patches filled on 6/29, and her oxycodone was filled earlier that week.  Her drug screen was negative.  She stated that she wants to come off her medications because 1 of the things that led to the death of her family members was liver problems and she is worried about all the medication she is on.  She has not seen a psychiatrist in 6 months or more.  It is odd that she had her antidepressant medications renewed, her BuSpar not renewed, and her thyroid not renewed.  We will have to contact some of her primary doctors to get a better idea of what is really going on.  We will continue some of her medications.  She has a reported history of interstitial cystitis, fibromyalgia, migraine headaches,  seizures, and anemia.  I am going to hold her thyroid medication given that she has a normal TSH.  I told her that we would withdraw her from the opiates.  I explained the opiate protocol.  She is in agreement with that.  I certify that inpatient services furnished can reasonably be expected to improve the patient's condition.   Sharma Covert, MD 01/24/2018, 3:53 PM

## 2018-01-24 NOTE — ED Notes (Signed)
Bed: WA27 Expected date:  Expected time:  Means of arrival:  Comments: 

## 2018-01-24 NOTE — ED Notes (Signed)
Called [facility] for tx to [facility]
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
## 2018-01-24 NOTE — Tx Team (Signed)
Initial Treatment Plan 01/24/2018 4:08 PM Alexis Singleton AQL:[id number]    PATIENT STRESSORS: Health problems Medication change or noncompliance   PATIENT STRENGTHS: Ability for insight Average or above average intelligence Capable of independent living General fund of knowledge   PATIENT IDENTIFIED PROBLEMS: Depression Anxiety Suicidal thoughts "work on coping skills and not shut down and isolate"                     DISCHARGE CRITERIA:  Ability to meet basic life and health needs Improved stabilization in mood, thinking, and/or behavior Verbal commitment to aftercare and medication compliance  PRELIMINARY DISCHARGE PLAN: Attend aftercare/continuing care group Return to previous living arrangement  PATIENT/FAMILY INVOLVEMENT: This treatment plan has been presented to and reviewed with the patient, Alexis Singleton, and/or family member, .  The patient and family have been given the opportunity to ask questions and make suggestions.  Mindie Rawdon, [place], [place] 01/24/2018, 4:08 PM

## 2018-01-24 NOTE — BHH Counselor (Signed)
Per Letitia Libra, RN, pt has been accepted to [facility] rm [id number] after 14:00.

## 2018-01-24 NOTE — ED Triage Notes (Signed)
Pt brought in by [facility].  Pt stated "I self cath & didn't bring any supplies.  I have sz's.  I've had my mom die in 09/07/22 and my grandfather died thee 11-04-22.  My 37 y/o daughter is going to live with her dad.  I wouldn't care if I went to sleep and didn't wake up."

## 2018-01-24 NOTE — ED Notes (Signed)
Patient self caths herself every 3 hours due to urinary problems, pt has been doing this for 5 years; pt wants to be able to have access to this when tx to another facility, MD notified

## 2018-01-24 NOTE — ED Notes (Signed)
Awaiting a bed at [facility]
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
## 2018-01-25 DIAGNOSIS — F319 Bipolar disorder, unspecified: Secondary | ICD-10-CM

## 2018-01-25 MED ORDER — CLONIDINE HCL 0.1 MG PO TABS
0.1000 mg | ORAL_TABLET | ORAL | Status: AC
  Administered 2018-01-27 – 2018-01-28 (×3): 0.1 mg via ORAL
  Filled 2018-01-25 (×7): qty 1

## 2018-01-25 MED ORDER — QUETIAPINE FUMARATE 50 MG PO TABS
50.0000 mg | ORAL_TABLET | Freq: Every day | ORAL | Status: DC
  Administered 2018-01-25 – 2018-01-26 (×2): 50 mg via ORAL
  Filled 2018-01-25 (×3): qty 1

## 2018-01-25 MED ORDER — CLONIDINE HCL 0.1 MG PO TABS
0.1000 mg | ORAL_TABLET | Freq: Four times a day (QID) | ORAL | Status: AC
  Administered 2018-01-25 – 2018-01-26 (×7): 0.1 mg via ORAL
  Filled 2018-01-25 (×7): qty 1

## 2018-01-25 MED ORDER — GLUCERNA SHAKE PO LIQD
237.0000 mL | Freq: Three times a day (TID) | ORAL | Status: DC
  Administered 2018-01-25 (×2): 237 mL via ORAL

## 2018-01-25 MED ORDER — CLONIDINE HCL 0.1 MG PO TABS
0.1000 mg | ORAL_TABLET | Freq: Every day | ORAL | Status: AC
  Administered 2018-01-29 – 2018-01-30 (×2): 0.1 mg via ORAL
  Filled 2018-01-25 (×2): qty 1

## 2018-01-25 MED ORDER — QUETIAPINE FUMARATE 25 MG PO TABS
25.0000 mg | ORAL_TABLET | Freq: Once | ORAL | Status: AC
  Administered 2018-01-25: 25 mg via ORAL
  Filled 2018-01-25 (×2): qty 1

## 2018-01-25 NOTE — Progress Notes (Signed)
Psychoeducational Group Note  Date:  01/25/2018 Time:  2311  Group Topic/Focus:  Wrap-Up Group:   The focus of this group is to help patients review their daily goal of treatment and discuss progress on daily workbooks.  Participation Level: Did Not Attend  Participation Quality:  Not Applicable  Affect:  Not Applicable  Cognitive:  Not Applicable  Insight:  Not Applicable  Engagement in Group: Not Applicable  Additional Comments:  The patient did not attend the evening N.A.meeting since she felt nauseated.   Jadon Harbaugh S 01/25/2018, 11:13 PM

## 2018-01-25 NOTE — Progress Notes (Signed)
NUTRITION ASSESSMENT  Consult received for patient with hx of bariatric surgery. Per Care Everywhere review, patient had Roux-en-Y in 2004.   INTERVENTION: 1. Recommend thiamine (100 mg/day), calcium (Tums TID), and vitamin B12 (350-500 mcg oral/day). 2. Continue to encourage PO intakes of meals and snacks; patient is okay to consume regular foods. 3. Recommend Regular diet order. 4. No bariatric-appropriate supplement available at [facility], closest is Glucerna Shake, so will order this BID; each supplement provides 220 kcal and 10 grams of protein.   NUTRITION DIAGNOSIS: Unintentional weight loss related to sub-optimal intake as evidenced by pt report.   Goal: Pt to meet >/= 90% of their estimated nutrition needs.  Monitor:  PO intake  Assessment:   Patient admitted yesterday for worsening depression over the past ~1 month. She reported that this was d/t deaths in the family, personal illnesses, and inability to follow-up with her PCP and psychiatrist to refill her medications and has, therefore, been rationing her medications. She reports irritability with mood swings, decreased sleep with only ~5 hours of sleep/night, and that she has been uninterested in eating.   Per review of Care Everywhere, she weighed 149 lb in February 2018 and 140 lb in February 2019. Current weight appears to be a stated weight. Patient reports losing 20 lb in the past 1 month. Unable to fully confirm with current weight being stated rather than scaled.   Past medical hx reviewed. No diet order in place at this time. As patient is 15 years out from Roux-en-Y, it is safe for her to consume regular foods, any foods that she is able to tolerate well given her other PMH. Recommend Regular diet so that patient is able to choose foods that she knows she tolerates without overt discomfort/symptoms.     37 y.o. female  Height: Ht Readings from Last 1 Encounters:  01/24/18 5\' 6"  (1.676 m)    Weight: Wt Readings  from Last 1 Encounters:  01/24/18 125 lb (56.7 kg)    Weight Hx: Wt Readings from Last 10 Encounters:  01/24/18 125 lb (56.7 kg)  01/24/18 125 lb (56.7 kg)  06/19/16 145 lb (65.8 kg)  01/18/15 140 lb (63.5 kg)  02/14/14 162 lb (73.5 kg)    BMI:  Body mass index is 20.18 kg/m. Pt meets criteria for normal weight based on current BMI.  Estimated Nutritional Needs: Kcal: 25-30 kcal/kg Protein: > 1 gram protein/kg Fluid: 1 ml/kcal  Diet Order:  Diet Order    None     Pt is also offered choice of unit snacks mid-morning and mid-afternoon.  Pt is eating as desired.   Lab results and medications reviewed.      Jarome Matin, MS, RD, LDN, [facility] Inpatient Clinical Dietitian Pager # [phone] After hours/weekend pager # [phone]

## 2018-01-25 NOTE — Progress Notes (Signed)
Pt came back from cafeteria complaining of N/V. Pt stated this happens all the time with even at home. Pt stated is she lay down, she will feel better. Pt in the bed resting,will continue to monitor.

## 2018-01-25 NOTE — Progress Notes (Signed)
[facility] MD Progress Note  01/25/2018 11:10 AM Alexis Singleton  MRN:  [id number]   Subjective: Patient presents today lying in her bed.  She reports that she actually slept some last night.  She denies any SI/HI/AVH and contracts for safety.  Patient retells stories about her blood ones being gone or dying and her significant history of been dealing with pain.  She is hoping to get medications that will stabilize her mood, help her with sleep, and get her back active again.  Objective: Patient's chart and findings reviewed and discussed with treatment team.  Patient presents is talkative and pleasant.  Her speech is mildly pressured and she is tangential still.  Patient did not seem to be responding to any internal stimuli.  Patient continues to complain about past history of loss of sleep, lability and mood, and patient is labile today as well.  Discussed with Dr. Mallie Darting is still suspect bipolar, so will start patient on low-dose of Seroquel at bedtime.  We will also start clonidine for opiate withdrawal.  Patient is still concerned about her thyroid as the labs were normal emergency room, so I agreed to be drawn TSH and T4 labs for tomorrow morning  Principal Problem: Bipolar disorder, unspecified ([place]) Diagnosis:   Patient Active Problem List   Diagnosis Date Noted  . Bipolar disorder, unspecified ([place]) [F31.9] 01/25/2018  . Major depressive disorder, recurrent severe without psychotic features ([place]) [F33.2] 01/24/2018  . GAD (generalized anxiety disorder) [F41.1] 01/24/2018  . Opiate withdrawal ([place]) [F11.23]   . Opioid dependence with withdrawal ([place]) [F11.23]   . Chronic back pain [M54.9, G89.29] 02/14/2014  . Chronic neck pain [M54.2, G89.29] 02/14/2014  . Fibromyalgia affecting multiple sites [M79.7] 02/14/2014  . Other chronic pain [G89.29] 02/14/2014   Total Time spent with patient: 30 minutes  Past Psychiatric History: See H&P  Past Medical History:  Past Medical History:  Diagnosis  Date  . 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 Arthritis   . Breast cancer ([place])   . Chronic pain   . Fibromyalgia   . IC (interstitial cystitis)   . Ovarian cancer ([place])   . Pulmonary embolism ([place])   . Seizures ([place])   . Spinal compression fracture ([place])   . Thyroid cancer ([place])   . Thyroid cancer ([place])   . Uterine cancer [facility])     Past Surgical History:  Procedure Laterality Date  . ABDOMINAL EXPLORATION SURGERY    . ABDOMINAL HYSTERECTOMY    . APPENDECTOMY    . BREAST LUMPECTOMY    . CHOLECYSTECTOMY    . COMBINED AUGMENTATION MAMMAPLASTY AND ABDOMINOPLASTY    . GASTRIC BYPASS    . I&D EXTREMITY Left 01/18/2015   Procedure: IRRIGATION AND DEBRIDEMENT, PINNING  AND  REPAIR NAIL BED LEFT MIDDLE FINGER;  Surgeon: Leanora Cover, MD;  Location: [facility];  Service: Orthopedics;  Laterality: Left;  . SINUSOTOMY    . THYROIDECTOMY     Family History:  Family History  Problem Relation Age of Onset  . Alcohol abuse Father    Family Psychiatric  History: See H&P Social History:  Social History   Substance and Sexual Activity  Alcohol Use Yes   Comment: occassionally     Social History   Substance and Sexual Activity  Drug Use Yes  . Types: Marijuana    Social History   Socioeconomic History  . Marital status: Legally Separated    Spouse name: Not on file  . Number of children: Not on file  .  Years of education: Not on file  . Highest education level: Not on file  Occupational History  . Not on file  Social Needs  . Financial resource strain: Not on file  . Food insecurity:    Worry: Not on file    Inability: Not on file  . Transportation needs:    Medical: Not on file    Non-medical: Not on file  Tobacco Use  . Smoking status: Never Smoker  . Smokeless tobacco: Never Used  Substance and Sexual Activity  . Alcohol use: Yes    Comment: occassionally  . Drug use: Yes    Types: Marijuana  . Sexual activity: Not Currently  Lifestyle  . Physical activity:     Days per week: Not on file    Minutes per session: Not on file  . Stress: Not on file  Relationships  . Social connections:    Talks on phone: Not on file    Gets together: Not on file    Attends religious service: Not on file    Active member of club or organization: Not on file    Attends meetings of clubs or organizations: Not on file    Relationship status: Not on file  Other Topics Concern  . Not on file  Social History Narrative  . Not on file   Additional Social History:                         Sleep: Fair  Appetite:  Fair  Current Medications: Current Facility-Administered Medications  Medication Dose Route Frequency Provider Last Rate Last Dose  . acetaminophen (TYLENOL) tablet 650 mg  650 mg Oral Q6H PRN Sharma Covert, MD      . alum & mag hydroxide-simeth (MAALOX/MYLANTA) 200-200-20 MG/5ML suspension 30 mL  30 mL Oral Q4H PRN Sharma Covert, MD      . busPIRone (BUSPAR) tablet 5 mg  5 mg Oral TID Sharma Covert, MD   5 mg at 01/25/18 0747  . cholecalciferol (VITAMIN D) tablet 400 Units  400 Units Oral Daily Sharma Covert, MD   400 Units at 01/25/18 [phone]  . cloNIDine (CATAPRES) tablet 0.1 mg  0.1 mg Oral QID Candid Bovey, Lowry Ram, FNP       Followed by  . [START ON 01/27/2018] cloNIDine (CATAPRES) tablet 0.1 mg  0.1 mg Oral BH-qamhs Phoenix Riesen, Lowry Ram, FNP       Followed by  . [START ON 01/29/2018] cloNIDine (CATAPRES) tablet 0.1 mg  0.1 mg Oral QAC breakfast Airelle Everding, Darnelle Maffucci B, FNP      . darifenacin (ENABLEX) 24 hr tablet 15 mg  15 mg Oral Daily Sharma Covert, MD   15 mg at 01/25/18 0747  . dicyclomine (BENTYL) tablet 20 mg  20 mg Oral Q6H PRN Sharma Covert, MD      . DULoxetine (CYMBALTA) DR capsule 60 mg  60 mg Oral BID Sharma Covert, MD   60 mg at 01/25/18 0747  . feeding supplement (GLUCERNA SHAKE) (GLUCERNA SHAKE) liquid 237 mL  237 mL Oral TID BM Sharma Covert, MD      . folic acid (FOLVITE) tablet 1 mg  1 mg Oral Daily Sharma Covert, MD   1 mg at 01/25/18 0747  . hydrOXYzine (ATARAX/VISTARIL) tablet 25 mg  25 mg Oral Q6H PRN Sharma Covert, MD   25 mg at 01/24/18 1818  . loperamide (IMODIUM) capsule 2-4 mg  2-4 mg Oral PRN Sharma Covert, MD      . magnesium hydroxide (MILK OF MAGNESIA) suspension 30 mL  30 mL Oral Daily PRN Sharma Covert, MD      . methocarbamol (ROBAXIN) tablet 500 mg  500 mg Oral Q8H PRN Sharma Covert, MD   500 mg at 01/24/18 2121  . multivitamin (PROSIGHT) tablet 1 tablet  1 tablet Oral Daily Sharma Covert, MD   1 tablet at 01/25/18 0747  . ondansetron (ZOFRAN-ODT) disintegrating tablet 4 mg  4 mg Oral Q6H PRN Sharma Covert, MD   4 mg at 01/25/18 0746  . pantoprazole (PROTONIX) EC tablet 40 mg  40 mg Oral Daily Sharma Covert, MD   40 mg at 01/25/18 0747  . QUEtiapine (SEROQUEL) tablet 50 mg  50 mg Oral QHS Mixtli [place], Lowry Ram, FNP      . SUMAtriptan (IMITREX) tablet 50 mg  50 mg Oral Q2H PRN Sharma Covert, MD      . tiZANidine (ZANAFLEX) tablet 4 mg  4 mg Oral TID Sharma Covert, MD   4 mg at 01/25/18 0748  . trimethoprim (TRIMPEX) tablet 100 mg  100 mg Oral Daily Sharma Covert, MD   100 mg at 01/24/18 2121    Lab Results:  Results for orders placed or performed during the hospital encounter of 01/24/18 (from the past 48 hour(s))  TSH     Status: None   Collection Time: 01/24/18  2:57 AM  Result Value Ref Range   TSH 3.006 0.350 - 4.500 uIU/mL    Comment: Performed by a 3rd Generation assay with a functional sensitivity of <=0.01 uIU/mL. Performed at [facility], [place] [address] [place], [place] [address]   T4, free     Status: None   Collection Time: 01/24/18  2:57 AM  Result Value Ref Range   Free T4 0.84 0.82 - 1.77 ng/dL    Comment: (NOTE) Biotin ingestion may interfere with free T4 tests. If the results are inconsistent with the TSH level, previous test results, or the clinical presentation, then consider biotin  interference. If needed, order repeat testing after stopping biotin. Performed at [facility], [place] [address], [place], [place] [address]   Urine rapid drug screen (hosp performed)     Status: Abnormal   Collection Time: 01/24/18  2:58 AM  Result Value Ref Range   Opiates NONE DETECTED NONE DETECTED   Cocaine NONE DETECTED NONE DETECTED   Benzodiazepines NONE DETECTED NONE DETECTED   Amphetamines NONE DETECTED NONE DETECTED   Tetrahydrocannabinol POSITIVE (A) NONE DETECTED   Barbiturates (A) NONE DETECTED    Result not available. Reagent lot number recalled by manufacturer.    Comment: Performed at [facility], [place] [address] [place], [place] [address]  Urinalysis, Routine w reflex microscopic     Status: None   Collection Time: 01/24/18  2:58 AM  Result Value Ref Range   Color, Urine YELLOW YELLOW   APPearance CLEAR CLEAR   Specific Gravity, Urine 1.021 1.005 - 1.030   pH 6.0 5.0 - 8.0   Glucose, UA NEGATIVE NEGATIVE mg/dL   Hgb urine dipstick NEGATIVE NEGATIVE   Bilirubin Urine NEGATIVE NEGATIVE   Ketones, ur NEGATIVE NEGATIVE mg/dL   Protein, ur NEGATIVE NEGATIVE mg/dL   Nitrite NEGATIVE NEGATIVE   Leukocytes, UA NEGATIVE NEGATIVE    Comment: Performed at [place] [address], [place],  [address]  I-Stat beta hCG blood, ED     Status: None   Collection Time: 01/24/18  3:03 AM  Result Value Ref Range   I-stat hCG, quantitative <5.0 <5 mIU/mL   Comment 3            Comment:   GEST. AGE      CONC.  (mIU/mL)   <=1 WEEK        5 - 50     2 WEEKS       50 - 500     3 WEEKS       100 - 10,000     4 WEEKS     1,000 - 30,000        FEMALE AND NON-PREGNANT FEMALE:     LESS THAN 5 mIU/mL   Comprehensive metabolic panel     Status: Abnormal   Collection Time: 01/24/18  3:44 AM  Result Value Ref Range   Sodium 137 135 - 145 mmol/L   Potassium 3.7 3.5 - 5.1 mmol/L   Chloride 106 98 - 111 mmol/L    Comment: Please note  change in reference range.   CO2 22 22 - 32 mmol/L   Glucose, Bld 112 (H) 70 - 99 mg/dL    Comment: Please note change in reference range.   BUN 13 6 - 20 mg/dL    Comment: Please note change in reference range.   Creatinine, Ser 0.50 0.44 - 1.00 mg/dL   Calcium 8.7 (L) 8.9 - 10.3 mg/dL   Total Protein 7.0 6.5 - 8.1 g/dL   Albumin 3.8 3.5 - 5.0 g/dL   AST 18 15 - 41 U/L   ALT 17 0 - 44 U/L    Comment: Please note change in reference range.   Alkaline Phosphatase 66 38 - 126 U/L   Total Bilirubin 0.7 0.3 - 1.2 mg/dL   GFR calc non Af Amer >60 >60 mL/min   GFR calc Af Amer >60 >60 mL/min    Comment: (NOTE) The eGFR has been calculated using the CKD EPI equation. This calculation has not been validated in all clinical situations. eGFR's persistently <60 mL/min signify possible Chronic Kidney Disease.    Anion gap 9 5 - 15    Comment: Performed at [facility], [place] [address] [place], [place] [address]  Ethanol     Status: None   Collection Time: 01/24/18  3:44 AM  Result Value Ref Range   Alcohol, Ethyl (B) <10 <10 mg/dL    Comment: Performed at [facility], [place] [address] [place], [place] [address]  CBC with Diff     Status: Abnormal   Collection Time: 01/24/18  3:44 AM  Result Value Ref Range   WBC 7.7 4.0 - 10.5 K/uL   RBC 4.04 3.87 - 5.11 MIL/uL   Hemoglobin 11.5 (L) 12.0 - 15.0 g/dL   HCT 35.2 (L) 36.0 - 46.0 %   MCV 87.1 78.0 - 100.0 fL   MCH 28.5 26.0 - 34.0 pg   MCHC 32.7 30.0 - 36.0 g/dL   RDW 13.3 11.5 - 15.5 %   Platelets 286 150 - 400 K/uL   Neutrophils Relative % 87 %   Neutro Abs 6.6 1.7 - 7.7 K/uL   Lymphocytes Relative 9 %   Lymphs Abs 0.7 0.7 - 4.0 K/uL   Monocytes Relative 3 %   Monocytes Absolute 0.3 0.1 - 1.0 K/uL   Eosinophils Relative 1 %   Eosinophils Absolute 0.1 0.0 -  0.7 K/uL   Basophils Relative 0 %   Basophils Absolute 0.0 0.0 - 0.1 K/uL    Comment: Performed at [facility], [place] [address], [place], [place] [address]  Salicylate level     Status: None   Collection Time: 01/24/18  3:44 AM  Result Value Ref Range   Salicylate Lvl <3.1 2.8 - 30.0 mg/dL    Comment: Performed at [facility], [place] [address] [place], [place] [address]  Acetaminophen level     Status: Abnormal   Collection Time: 01/24/18  3:44 AM  Result Value Ref Range   Acetaminophen (Tylenol), Serum <10 (L) 10 - 30 ug/mL    Comment: Performed at [facility], [place] [address], [place], [place] [address]  hCG, quantitative, pregnancy     Status: None   Collection Time: 01/24/18  3:44 AM  Result Value Ref Range   hCG, Beta Chain, Quant, S <1 <5 mIU/mL    Comment:          GEST. AGE      CONC.  (mIU/mL)   <=1 WEEK        5 - 50     2 WEEKS       50 - 500     3 WEEKS       100 - 10,000     4 WEEKS     1,000 - 30,000     5 WEEKS     3,500 - 115,000   6-8 WEEKS     12,000 - 270,000    12 WEEKS     15,000 - 220,000        FEMALE AND NON-PREGNANT FEMALE:     LESS THAN 5 mIU/mL Performed at [facility], [place] [address], [place], [place] [address]     Blood Alcohol level:  Lab Results  Component Value Date   ETH <10 94/85/4627    Metabolic Disorder Labs: No results found for: HGBA1C, MPG No results found for: PROLACTIN No results found for: CHOL, TRIG, HDL, CHOLHDL, VLDL, LDLCALC  Physical Findings: AIMS: Facial and Oral Movements Muscles of Facial Expression: None, normal Lips and Perioral Area: None, normal Jaw: None, normal Tongue: None, normal,Extremity Movements Upper (arms, wrists, hands, fingers): None, normal Lower (legs, knees, ankles, toes): None, normal, Trunk Movements Neck, shoulders, hips: None, normal, Overall Severity Severity of abnormal movements (highest score from questions above): None, normal Incapacitation due to abnormal movements: None, normal Patient's awareness of abnormal movements (rate only patient's  report): No Awareness, Dental Status Current problems with teeth and/or dentures?: No Does patient usually wear dentures?: No  CIWA:    COWS:  COWS Total Score: 8  Musculoskeletal: Strength & Muscle Tone: within normal limits Gait & Station: normal Patient leans: N/A  Psychiatric Specialty Exam: Physical Exam  Nursing note and vitals reviewed. Constitutional: She is oriented to person, place, and time. She appears well-developed and well-nourished.  Respiratory: Effort normal.  Musculoskeletal: Normal range of motion.  Neurological: She is alert and oriented to person, place, and time.  Skin: Skin is warm.    Review of Systems  Constitutional: Negative.   HENT: Negative.   Eyes: Negative.   Respiratory: Negative.   Cardiovascular: Negative.   Gastrointestinal: Negative.   Genitourinary: Negative.   Musculoskeletal: Negative.   Skin: Negative.   Neurological: Negative.   Endo/Heme/Allergies: Negative.   Psychiatric/Behavioral: The patient is nervous/anxious.     Blood pressure 106/77, pulse Marland Kitchen)  133, temperature 98.5 F (36.9 C), temperature source Oral, resp. rate 16, height 5' 6"  (1.676 m), weight 56.7 kg (125 lb).Body mass index is 20.18 kg/m.  General Appearance: Casual  Eye Contact:  Good  Speech:  Clear and Coherent and Pressured  Volume:  Increased  Mood:  Anxious  Affect:  Labile  Thought Process:  Linear and Descriptions of Associations: Tangential  Orientation:  Full (Time, Place, and Person)  Thought Content:  WDL  Suicidal Thoughts:  No  Homicidal Thoughts:  No  Memory:  Immediate;   Good Recent;   Good Remote;   Good  Judgement:  Fair  Insight:  Fair  Psychomotor Activity:  Normal  Concentration:  Concentration: Good and Attention Span: Good  Recall:  Good  Fund of Knowledge:  Good  Language:  Good  Akathisia:  No  Handed:  Right  AIMS (if indicated):     Assets:  Communication Skills Desire for Improvement Financial  Resources/Insurance Housing Physical Health Social Support Transportation  ADL's:  Intact  Cognition:  WNL  Sleep:  Number of Hours: 6.75   Problems addressed Opiate dependence Opiate withdrawal GAD Bipolar disorder unspecified  Treatment Plan Summary: Daily contact with patient to assess and evaluate symptoms and progress in treatment, Medication management and Plan is to: -Start Seroquel 50 mg p.o. nightly for mood stability -Discontinue trazodone 50 mg p.o. nightly -Continue Cymbalta 60 mg p.o. twice daily for mood stability - Start clonidine detox protocol -Continue BuSpar 5 mg p.o. 3 times daily for anxiety -Encourage group therapy participation  Lewis Shock, FNP 01/25/2018, 11:10 AM

## 2018-01-25 NOTE — BHH Group Notes (Signed)
[facility] Group Therapy 01/25/2018 1:15pm  Type of Therapy: [facility] Presentation  Participation Level: Invited. Chose to remain in bed.   Avelina Laine, LCSW 01/25/2018 10:52 AM

## 2018-01-25 NOTE — Progress Notes (Signed)
D: Patient observed resting in bed awake and alert. Patient states she continues to struggle with nausea and is unsure if this is her baseline or if it is worsened or due to withdrawal as she typically has her fentanyl patch on. Reports vomiting earlier today. Patient indicates she is comfortable with plan to wean from her opiates for chronic pain as she feels she has developed a potential tolerance. Reports restlessness, anxiety but denies overt withdrawal. Did not attend group this evening. Patient's affect animated, anxious with restless, anxious mood.   Denies pain at this time.   A: Medicated per orders, no prns requested or required (zofran cannot be given until after 2300). Medication education provided. Level III obs in place for safety. Emotional support offered. Patient encouraged to complete Suicide Safety Plan before discharge. Encouraged to attend and participate in unit programming.  Fall prevention plan in place and reviewed with patient as pt is a high fall risk due to hx of seizures.   R: Patient verbalizes understanding of POC, falls prevention education. Patient states, "I just want to go to sleep" and verified with this writer that she does not mean from a suicidal standpoint. States, "I haven't been sleeping so I know my mind and body aren't rested." Patient denies SI/HI/AVH and remains safe on level III obs. Will continue to monitor throughout the night.

## 2018-01-25 NOTE — BHH Group Notes (Signed)
[facility] Group Notes:  (Nursing/MHT/Case Management/Adjunct)  Date:  01/25/2018  Time:  6:10 PM  Type of Therapy:  Psychoeducational Skills  Participation Level:  Did Not Attend  Participation Quality:  DID NOT ATTEND  Affect:  DID NOT ATTEND  Cognitive:  DID NOT ATTEND  Insight:  None  Engagement in Group:  DID NOT ATTEND  Modes of Intervention:  DID NOT ATTEND  Summary of Progress/Problems: Pt did not attend patient self inventory group.   Wolfgang Phoenix 01/25/2018, 6:10 PM

## 2018-01-25 NOTE — Progress Notes (Signed)
DAR NOTE: Patient presents with anxious affect and depressed mood, appeared glittery and unable to stay still. Pt has been present in the milieu interacting with peer. Pt has complaining of nausea and vomiting unwitnessed  and has been asking for Zofran, stating she takes it at home every 4 hrs. Reports good sleep, fair appetite, low energy, and good concentration. Denies pain, auditory and visual hallucinations.  Rates depression at 8, hopelessness at 7, and anxiety at 8.  Maintained on routine safety checks.  Medications given as prescribed.  Support and encouragement offered as needed. States goal for today is " coping skills." Will continue to monitor.

## 2018-01-25 NOTE — Plan of Care (Signed)
Patient verbalizes understanding of information, education provided. 

## 2018-01-25 NOTE — Tx Team (Signed)
Interdisciplinary Treatment and Diagnostic Plan Update  01/25/2018 Time of Session: 0830AM Alexis Singleton MRN: [id number]  Principal Diagnosis: MDD  Secondary Diagnoses: Active Problems:   GAD (generalized anxiety disorder)   Opiate withdrawal (HCC)   Opioid dependence with withdrawal (HCC)   Current Medications:  Current Facility-Administered Medications  Medication Dose Route Frequency Provider Last Rate Last Dose  . acetaminophen (TYLENOL) tablet 650 mg  650 mg Oral Q6H PRN Sharma Covert, MD      . alum & mag hydroxide-simeth (MAALOX/MYLANTA) 200-200-20 MG/5ML suspension 30 mL  30 mL Oral Q4H PRN Sharma Covert, MD      . busPIRone (BUSPAR) tablet 5 mg  5 mg Oral TID Sharma Covert, MD   5 mg at 01/25/18 0747  . cholecalciferol (VITAMIN D) tablet 400 Units  400 Units Oral Daily Sharma Covert, MD   400 Units at 01/25/18 [id number]  . darifenacin (ENABLEX) 24 hr tablet 15 mg  15 mg Oral Daily Sharma Covert, MD   15 mg at 01/25/18 0747  . dicyclomine (BENTYL) tablet 20 mg  20 mg Oral Q6H PRN Sharma Covert, MD      . DULoxetine (CYMBALTA) DR capsule 60 mg  60 mg Oral BID Sharma Covert, MD   60 mg at 01/25/18 0747  . folic acid (FOLVITE) tablet 1 mg  1 mg Oral Daily Sharma Covert, MD   1 mg at 01/25/18 0747  . hydrOXYzine (ATARAX/VISTARIL) tablet 25 mg  25 mg Oral Q6H PRN Sharma Covert, MD   25 mg at 01/24/18 1818  . loperamide (IMODIUM) capsule 2-4 mg  2-4 mg Oral PRN Sharma Covert, MD      . magnesium hydroxide (MILK OF MAGNESIA) suspension 30 mL  30 mL Oral Daily PRN Sharma Covert, MD      . methocarbamol (ROBAXIN) tablet 500 mg  500 mg Oral Q8H PRN Sharma Covert, MD   500 mg at 01/24/18 2121  . multivitamin (PROSIGHT) tablet 1 tablet  1 tablet Oral Daily Sharma Covert, MD   1 tablet at 01/25/18 0747  . ondansetron (ZOFRAN-ODT) disintegrating tablet 4 mg  4 mg Oral Q6H PRN Sharma Covert, MD   4 mg at 01/25/18 0746  .  pantoprazole (PROTONIX) EC tablet 40 mg  40 mg Oral Daily Sharma Covert, MD   40 mg at 01/25/18 0747  . SUMAtriptan (IMITREX) tablet 50 mg  50 mg Oral Q2H PRN Sharma Covert, MD      . tiZANidine (ZANAFLEX) tablet 4 mg  4 mg Oral TID Sharma Covert, MD   4 mg at 01/25/18 0748  . traZODone (DESYREL) tablet 50 mg  50 mg Oral QHS PRN Sharma Covert, MD   50 mg at 01/24/18 2121  . trimethoprim (TRIMPEX) tablet 100 mg  100 mg Oral Daily Sharma Covert, MD   100 mg at 01/24/18 2121   PTA Medications: Medications Prior to Admission  Medication Sig Dispense Refill Last Dose  . busPIRone (BUSPAR) 15 MG tablet Take 15 mg by mouth 2 (two) times daily.   01/23/2018 at Unknown time  . Cyanocobalamin 1000 MCG/ML KIT Inject 1,000 mg as directed every 30 (thirty) days.   Past Month at Unknown time  . DULoxetine (CYMBALTA) 60 MG capsule Take 60 mg by mouth 2 (two) times daily.    01/23/2018 at Unknown time  . ferrous sulfate 325 (65 FE) MG tablet Take 1 tablet (325  mg total) by mouth 2 (two) times daily with a meal. 60 tablet 1 Past Month at Unknown time  . lidocaine (XYLOCAINE) 1 % (with preservative) injection 2 mLs by Other route as needed (for bladder).   Past Week at Unknown time  . Meth-Hyo-M Bl-Na Phos-Ph Sal (URIBEL) 118 MG CAPS Take 118 mg by mouth as needed (for bladder).    Over a month ago  . Multiple Vitamin (MULTIVITAMIN) capsule Take 1 capsule by mouth daily.    01/23/2018 at Unknown time  . ondansetron (ZOFRAN-ODT) 8 MG disintegrating tablet Take 8 mg by mouth every 8 (eight) hours as needed for nausea or vomiting.   01/23/2018 at Unknown time  . pantoprazole (PROTONIX) 20 MG tablet Take 1 tablet (20 mg total) by mouth daily. (Patient taking differently: Take 40 mg by mouth daily. ) 30 tablet 0 Past Month at Unknown time  . pentosan polysulfate (ELMIRON) 100 MG capsule Place 100 mg into the urethra as needed (for bladder).    Past Week at Unknown time  . sodium bicarbonate 8.4% (1  mEq/mL) SOLN infusion Place 1 mEq/kg/day into the urethra as directed.    Past Week at Unknown time  . solifenacin (VESICARE) 10 MG tablet Take 10 mg by mouth daily.    01/23/2018 at Unknown time  . SUMAtriptan (IMITREX) 6 MG/0.5ML SOLN injection Inject 6 mg into the skin every 2 (two) hours as needed for migraine or headache. May repeat in 2 hours if headache persists or recurs.   01/23/2018 at Unknown time  . thyroid (ARMOUR) 60 MG tablet Take 60 mg by mouth daily before breakfast.   Over a month ago at Unknown time  . tiZANidine (ZANAFLEX) 4 MG capsule Take 4 mg by mouth 4 (four) times daily.    01/23/2018 at Unknown time  . traZODone (DESYREL) 50 MG tablet Take 100 mg by mouth at bedtime.   Past Week at Unknown time    Patient Stressors: Health problems Medication change or noncompliance  Patient Strengths: Ability for insight Average or above average intelligence Capable of independent living General fund of knowledge  Treatment Modalities: Medication Management, Group therapy, Case management,  1 to 1 session with clinician, Psychoeducation, Recreational therapy.   Physician Treatment Plan for Primary Diagnosis: MDD   Long Term Goal(s): Improvement in symptoms so as ready for discharge Improvement in symptoms so as ready for discharge   Short Term Goals: Ability to identify changes in lifestyle to reduce recurrence of condition will improve Ability to verbalize feelings will improve Ability to disclose and discuss suicidal ideas Ability to demonstrate self-control will improve Ability to identify and develop effective coping behaviors will improve Ability to maintain clinical measurements within normal limits will improve Compliance with prescribed medications will improve Ability to identify triggers associated with substance abuse/mental health issues will improve Ability to identify changes in lifestyle to reduce recurrence of condition will improve Ability to verbalize feelings  will improve Ability to disclose and discuss suicidal ideas Ability to demonstrate self-control will improve Ability to identify and develop effective coping behaviors will improve Ability to maintain clinical measurements within normal limits will improve Compliance with prescribed medications will improve Ability to identify triggers associated with substance abuse/mental health issues will improve  Medication Management: Evaluate patient's response, side effects, and tolerance of medication regimen.  Therapeutic Interventions: 1 to 1 sessions, Unit Group sessions and Medication administration.  Evaluation of Outcomes: Progressing  Physician Treatment Plan for Secondary Diagnosis: Active Problems:   GAD (  generalized anxiety disorder)   Opiate withdrawal (HCC)   Opioid dependence with withdrawal ([place])  Long Term Goal(s): Improvement in symptoms so as ready for discharge Improvement in symptoms so as ready for discharge   Short Term Goals: Ability to identify changes in lifestyle to reduce recurrence of condition will improve Ability to verbalize feelings will improve Ability to disclose and discuss suicidal ideas Ability to demonstrate self-control will improve Ability to identify and develop effective coping behaviors will improve Ability to maintain clinical measurements within normal limits will improve Compliance with prescribed medications will improve Ability to identify triggers associated with substance abuse/mental health issues will improve Ability to identify changes in lifestyle to reduce recurrence of condition will improve Ability to verbalize feelings will improve Ability to disclose and discuss suicidal ideas Ability to demonstrate self-control will improve Ability to identify and develop effective coping behaviors will improve Ability to maintain clinical measurements within normal limits will improve Compliance with prescribed medications will improve Ability  to identify triggers associated with substance abuse/mental health issues will improve     Medication Management: Evaluate patient's response, side effects, and tolerance of medication regimen.  Therapeutic Interventions: 1 to 1 sessions, Unit Group sessions and Medication administration.  Evaluation of Outcomes: Progressing   RN Treatment Plan for Primary Diagnosis:MDD Long Term Goal(s): Knowledge of disease and therapeutic regimen to maintain health will improve  Short Term Goals: Ability to remain free from injury will improve, Ability to demonstrate self-control and Ability to disclose and discuss suicidal ideas  Medication Management: RN will administer medications as ordered by provider, will assess and evaluate patient's response and provide education to patient for prescribed medication. RN will report any adverse and/or side effects to prescribing provider.  Therapeutic Interventions: 1 on 1 counseling sessions, Psychoeducation, Medication administration, Evaluate responses to treatment, Monitor vital signs and CBGs as ordered, Perform/monitor CIWA, COWS, AIMS and Fall Risk screenings as ordered, Perform wound care treatments as ordered.  Evaluation of Outcomes: Progressing   LCSW Treatment Plan for Primary Diagnosis: MDD Long Term Goal(s): Safe transition to appropriate next level of care at discharge, Engage patient in therapeutic group addressing interpersonal concerns.  Short Term Goals: Engage patient in aftercare planning with referrals and resources, Facilitate patient progression through stages of change regarding substance use diagnoses and concerns and Identify triggers associated with mental health/substance abuse issues  Therapeutic Interventions: Assess for all discharge needs, 1 to 1 time with Social worker, Explore available resources and support systems, Assess for adequacy in community support network, Educate family and significant other(s) on suicide prevention,  Complete Psychosocial Assessment, Interpersonal group therapy.  Evaluation of Outcomes: Progressing   Progress in Treatment: Attending groups: Yes. Participating in groups: Yes. Taking medication as prescribed: Yes. Toleration medication: Yes. Family/Significant other contact made: No, will contact:  family member if pt consents to collateral contact.  Patient understands diagnosis: Yes. Discussing patient identified problems/goals with staff: Yes. Medical problems stabilized or resolved: Yes. Denies suicidal/homicidal ideation: No. Issues/concerns per patient self-inventory: No.  Other: n/a  New problem(s) identified: No, Describe:  n/a  New Short Term/Long Term Goal(s): medication management for mood stabilization; elimination of SI thoughts; development of comprehensive mental wellness/sobriety plan.   Patient Goals:  "to work on my coping skills so I don't isolate and push people away."   Discharge Plan or Barriers: CSW assessing--pt goes to Rhae Lerner PA with [facility] services. She is interested in therapy referral. [place] pamphlet, [facility] information provided to patient for additional  community support and resources.   Reason for Continuation of Hospitalization: Anxiety Depression Medication stabilization Suicidal ideation  Estimated Length of Stay: Friday, 01/27/18  Attendees: Patient: Alexis Singleton 01/25/2018 8:34 AM  Physician: Dr. Mallie Darting MD 01/25/2018 8:34 AM  Nursing: Jeanie Cooks RN 01/25/2018 8:34 AM  RN Care Manager:x 01/25/2018 8:34 AM  Social Worker: Janice Norrie LCSW 01/25/2018 8:34 AM  Recreational Therapist: x 01/25/2018 8:34 AM  Other: Lindell Spar NP; Darnelle Maffucci Money NP 01/25/2018 8:34 AM  Other:  01/25/2018 8:34 AM  Other: 01/25/2018 8:34 AM    Scribe for Treatment Team: Avelina Laine, LCSW 01/25/2018 8:34 AM

## 2018-01-25 NOTE — [facility] Counselor (Signed)
Pt sleeping in room this afternoon. Will attempt PSA tomorrow.  Arleigh Odowd S. Ouida Sills, MSW, LCSW Clinical Social Worker 01/25/2018 1:55 PM

## 2018-01-26 DIAGNOSIS — F3181 Bipolar II disorder: Secondary | ICD-10-CM | POA: Diagnosis present

## 2018-01-26 DIAGNOSIS — F3132 Bipolar disorder, current episode depressed, moderate: Secondary | ICD-10-CM

## 2018-01-26 LAB — TSH: TSH: 3.199 u[IU]/mL (ref 0.350–4.500)

## 2018-01-26 LAB — T4, FREE: FREE T4: 0.88 ng/dL (ref 0.82–1.77)

## 2018-01-26 MED ORDER — TRAZODONE HCL 50 MG PO TABS
50.0000 mg | ORAL_TABLET | Freq: Every evening | ORAL | Status: DC | PRN
  Administered 2018-01-26 – 2018-01-29 (×4): 50 mg via ORAL
  Filled 2018-01-26 (×4): qty 1

## 2018-01-26 MED ORDER — ONDANSETRON 4 MG PO TBDP
8.0000 mg | ORAL_TABLET | Freq: Three times a day (TID) | ORAL | Status: DC | PRN
  Administered 2018-01-26 – 2018-01-30 (×8): 8 mg via ORAL
  Filled 2018-01-26 (×8): qty 2

## 2018-01-26 MED ORDER — BUSPIRONE HCL 10 MG PO TABS
10.0000 mg | ORAL_TABLET | Freq: Three times a day (TID) | ORAL | Status: DC
  Administered 2018-01-26 – 2018-01-27 (×4): 10 mg via ORAL
  Filled 2018-01-26 (×9): qty 1

## 2018-01-26 MED ORDER — METHOCARBAMOL 750 MG PO TABS: 750.0000 mg | ORAL_TABLET | Freq: Three times a day (TID) | ORAL | Status: DC | PRN

## 2018-01-26 NOTE — Progress Notes (Signed)
[facility] MD Progress Note  01/26/2018 12:29 PM Alexis Singleton  MRN:  [id number]   Subjective: Patient states: "I'm here because everyone in my family keeps dying. It's like death is all around me and everyone is dying. I'm doing the best I can."  Objective: Pt seen and chart reviewed. Pt is alert/oriented x4, calm, cooperative, and appropriate to situation. Pt denies suicidal/homicidal ideation and psychosis and does not appear to be responding to internal stimuli. Pt states she has felt very depressed lately in terms of caring for her family yet neglecting her own personal care in the midst of numerous family members' recent passing.   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 Pt has a longstanding history of severe pain from numerous complications from the above conditions. This is all documented in [facility] records for >8 years as she sees our treatment teams for Oncology and Neuro.   Pt and I had a lengthy discussion about her pain management and history. 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 Pt had expressed concerns about hepatic function on chronic pain management. Her LFT's are excellent. I checked her PMP aware / DEA and she has had the same provider for years and the same pharmacy with no evidence of suspicious behavior or dependence.  Additionally, pt has not exhibited any med-seeking behaviors whatsoever. Pt has no history of substance abuse. Considering all these factors, I am ordering a hospitalist consult to come by and consult with her about pain management options and counsel her on risk/benefit ratios and hepatic function.    Principal Problem: Bipolar 2 disorder, major depressive episode ([place]) Diagnosis:   Patient Active Problem List   Diagnosis Date Noted  . Bipolar 2  disorder, major depressive episode ([place]) [F31.81] 01/26/2018  . Major depressive disorder, recurrent severe without psychotic features ([place]) [F33.2] 01/24/2018  . GAD (generalized anxiety disorder) [F41.1] 01/24/2018  . Opiate withdrawal ([place]) [F11.23]   . Opioid dependence with withdrawal ([place]) [F11.23]   . Chronic back pain [M54.9, G89.29] 02/14/2014  . Chronic neck pain [M54.2, G89.29] 02/14/2014  . Fibromyalgia affecting multiple sites [M79.7] 02/14/2014  . Other chronic pain [G89.29] 02/14/2014   Total Time spent with patient: 30 minutes  Past Psychiatric History: See H&P  Past Medical History:  Past Medical History:  Diagnosis Date  . 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 Arthritis   . Breast cancer ([place])   . Chronic pain   . Fibromyalgia   . IC (interstitial cystitis)   . Ovarian cancer ([place])   . Pulmonary embolism ([place])   . Seizures ([place])   . Spinal compression fracture ([place])   . Thyroid cancer ([place])   . Thyroid cancer ([place])   . Uterine cancer [facility])     Past Surgical History:  Procedure Laterality Date  . ABDOMINAL EXPLORATION SURGERY    . ABDOMINAL HYSTERECTOMY    . APPENDECTOMY    . BREAST LUMPECTOMY    . CHOLECYSTECTOMY    . COMBINED AUGMENTATION MAMMAPLASTY AND ABDOMINOPLASTY    . GASTRIC BYPASS    . I&D EXTREMITY Left 01/18/2015   Procedure: IRRIGATION AND DEBRIDEMENT, PINNING  AND  REPAIR NAIL BED LEFT MIDDLE FINGER;  Surgeon: Leanora Cover, MD;  Location: [place];  Service: Orthopedics;  Laterality: Left;  . SINUSOTOMY    .  THYROIDECTOMY     Family History:  Family History  Problem Relation Age of Onset  . Alcohol abuse Father    Family Psychiatric  History: See H&P Social History:  Social History   Substance and Sexual Activity  Alcohol Use Yes   Comment: occassionally     Social History   Substance and Sexual Activity  Drug Use Yes  . Types: Marijuana    Social History   Socioeconomic History  . Marital status: Legally Separated     Spouse name: Not on file  . Number of children: Not on file  . Years of education: Not on file  . Highest education level: Not on file  Occupational History  . Not on file  Social Needs  . Financial resource strain: Not on file  . Food insecurity:    Worry: Not on file    Inability: Not on file  . Transportation needs:    Medical: Not on file    Non-medical: Not on file  Tobacco Use  . Smoking status: Never Smoker  . Smokeless tobacco: Never Used  Substance and Sexual Activity  . Alcohol use: Yes    Comment: occassionally  . Drug use: Yes    Types: Marijuana  . Sexual activity: Not Currently  Lifestyle  . Physical activity:    Days per week: Not on file    Minutes per session: Not on file  . Stress: Not on file  Relationships  . Social connections:    Talks on phone: Not on file    Gets together: Not on file    Attends religious service: Not on file    Active member of club or organization: Not on file    Attends meetings of clubs or organizations: Not on file    Relationship status: Not on file  Other Topics Concern  . Not on file  Social History Narrative  . Not on file   Additional Social History:                         Sleep: Fair  Appetite:  Fair  Current Medications: Current Facility-Administered Medications  Medication Dose Route Frequency Provider Last Rate Last Dose  . acetaminophen (TYLENOL) tablet 650 mg  650 mg Oral Q6H PRN Sharma Covert, MD   650 mg at 01/26/18 0835  . alum & mag hydroxide-simeth (MAALOX/MYLANTA) 200-200-20 MG/5ML suspension 30 mL  30 mL Oral Q4H PRN Sharma Covert, MD      . busPIRone (BUSPAR) tablet 10 mg  10 mg Oral TID Benjamine Mola, FNP   10 mg at 01/26/18 1229  . cholecalciferol (VITAMIN D) tablet 400 Units  400 Units Oral Daily Sharma Covert, MD   400 Units at 01/26/18 0831  . cloNIDine (CATAPRES) tablet 0.1 mg  0.1 mg Oral QID Money, Darnelle Maffucci B, FNP   0.1 mg at 01/26/18 1200   Followed by  . [START  ON 01/27/2018] cloNIDine (CATAPRES) tablet 0.1 mg  0.1 mg Oral BH-qamhs Money, Lowry Ram, FNP       Followed by  . [START ON 01/29/2018] cloNIDine (CATAPRES) tablet 0.1 mg  0.1 mg Oral QAC breakfast Money, Darnelle Maffucci B, FNP      . darifenacin (ENABLEX) 24 hr tablet 15 mg  15 mg Oral Daily Sharma Covert, MD   15 mg at 01/26/18 5093  . dicyclomine (BENTYL) tablet 20 mg  20 mg Oral Q6H PRN Sharma Covert, MD      .  DULoxetine (CYMBALTA) DR capsule 60 mg  60 mg Oral BID Sharma Covert, MD   60 mg at 01/26/18 6644  . feeding supplement (GLUCERNA SHAKE) (GLUCERNA SHAKE) liquid 237 mL  237 mL Oral TID BM Sharma Covert, MD   237 mL at 03/47/42 5956  . folic acid (FOLVITE) tablet 1 mg  1 mg Oral Daily Sharma Covert, MD   1 mg at 01/26/18 3875  . hydrOXYzine (ATARAX/VISTARIL) tablet 25 mg  25 mg Oral Q6H PRN Sharma Covert, MD   25 mg at 01/24/18 1818  . loperamide (IMODIUM) capsule 2-4 mg  2-4 mg Oral PRN Sharma Covert, MD      . magnesium hydroxide (MILK OF MAGNESIA) suspension 30 mL  30 mL Oral Daily PRN Sharma Covert, MD      . methocarbamol (ROBAXIN) tablet 500 mg  500 mg Oral Q8H PRN Sharma Covert, MD   500 mg at 01/24/18 2121  . multivitamin (PROSIGHT) tablet 1 tablet  1 tablet Oral Daily Sharma Covert, MD   1 tablet at 01/26/18 [phone]  . ondansetron (ZOFRAN-ODT) disintegrating tablet 8 mg  8 mg Oral Q8H PRN Evelette Hollern, Elyse Jarvis, FNP      . pantoprazole (PROTONIX) EC tablet 40 mg  40 mg Oral Daily Sharma Covert, MD   40 mg at 01/26/18 2951  . QUEtiapine (SEROQUEL) tablet 50 mg  50 mg Oral QHS Money, Lowry Ram, FNP   50 mg at 01/25/18 2152  . SUMAtriptan (IMITREX) tablet 50 mg  50 mg Oral Q2H PRN Sharma Covert, MD      . tiZANidine (ZANAFLEX) tablet 4 mg  4 mg Oral TID Sharma Covert, MD   4 mg at 01/26/18 1201  . trimethoprim (TRIMPEX) tablet 100 mg  100 mg Oral Daily Sharma Covert, MD   100 mg at 01/26/18 8841    Lab Results:  Results for orders  placed or performed during the hospital encounter of 01/24/18 (from the past 48 hour(s))  TSH     Status: None   Collection Time: 01/26/18  6:23 AM  Result Value Ref Range   TSH 3.199 0.350 - 4.500 uIU/mL    Comment: Performed by a 3rd Generation assay with a functional sensitivity of <=0.01 uIU/mL. Performed at [facility], [place] [address], [place], [place] [address]   T4, free     Status: None   Collection Time: 01/26/18  6:23 AM  Result Value Ref Range   Free T4 0.88 0.82 - 1.77 ng/dL    Comment: (NOTE) Biotin ingestion may interfere with free T4 tests. If the results are inconsistent with the TSH level, previous test results, or the clinical presentation, then consider biotin interference. If needed, order repeat testing after stopping biotin. Performed at [facility], [place] [address] [place], [place] [address]     Blood Alcohol level:  Lab Results  Component Value Date   ETH <10 09/32/3557    Metabolic Disorder Labs: No results found for: HGBA1C, MPG No results found for: PROLACTIN No results found for: CHOL, TRIG, HDL, CHOLHDL, VLDL, LDLCALC  Physical Findings: AIMS: Facial and Oral Movements Muscles of Facial Expression: None, normal Lips and Perioral Area: None, normal Jaw: None, normal Tongue: None, normal,Extremity Movements Upper (arms, wrists, hands, fingers): None, normal Lower (legs, knees, ankles, toes): None, normal, Trunk Movements Neck, shoulders, hips: None, normal, Overall Severity Severity of abnormal movements (highest score from questions above): None,  normal Incapacitation due to abnormal movements: None, normal Patient's awareness of abnormal movements (rate only patient's report): No Awareness, Dental Status Current problems with teeth and/or dentures?: No Does patient usually wear dentures?: No  CIWA:    COWS:  COWS Total Score: 5  Musculoskeletal: Strength & Muscle Tone: within normal limits Gait & Station:  normal Patient leans: N/A  Psychiatric Specialty Exam: Physical Exam  Nursing note and vitals reviewed.   Review of Systems  Constitutional: Negative.   HENT: Negative.   Eyes: Negative.   Respiratory: Negative.   Cardiovascular: Negative.   Gastrointestinal: Negative.   Genitourinary: Negative.   Musculoskeletal: Negative.   Skin: Negative.   Neurological: Negative.   Endo/Heme/Allergies: Negative.   Psychiatric/Behavioral: Positive for depression. Negative for substance abuse and suicidal ideas. The patient is nervous/anxious and has insomnia.     Blood pressure 101/83, pulse (!) 101, temperature 98.3 F (36.8 C), temperature source Oral, resp. rate 16, height 5\' 6"  (1.676 m), weight 56.7 kg (125 lb).Body mass index is 20.18 kg/m.  General Appearance: Casual and fairly groomed  Eye Contact:  Fair  Speech:  Clear, coherent, normal rate  Volume:  Normal  Mood:  Euthymic  Affect:  Congruent  Thought Process:  Linear, logical, goal-directed  Orientation:  Full (Time, Place, and Person)  Thought Content:  Focused on management of her depression  Suicidal Thoughts:  No  Homicidal Thoughts:  No  Memory:  Immediate;   Good Recent;   Good Remote;   Good  Judgement:  Fair  Insight:  Fair  Psychomotor Activity:  Normal  Concentration:  Concentration: Good and Attention Span: Good  Recall:  Good  Fund of Knowledge:  Good  Language:  Good  Akathisia:  No  Handed:  Right  AIMS (if indicated):     Assets:  Communication Skills Desire for Improvement Financial Resources/Insurance Housing Physical Health Social Support Transportation  ADL's:  Intact  Cognition:  WNL  Sleep:  Number of Hours: 4.25   Problems addressed Opiate dependence Opiate withdrawal GAD Bipolar 2 disorder   Treatment Plan Summary: Daily contact with patient to assess and evaluate symptoms and progress in treatment, Medication management and Plan is to: -Continue Seroquel 50 mg p.o. nightly for  mood stability -Restart trazodone 50 mg PO QHS PRN insomnia (while titrating seroquel; pt will only ask for this if unable to sleep on Seroquel alone; discussed in detail) -Continue Cymbalta 60 mg p.o. twice daily for mood stability - Continue clonidine detox protocol --Titrate Buspar to home dose of 30mg  daily (10mg  PO TID)  for anxiety -Encourage group therapy participation  Benjamine Mola, FNP 01/26/2018, 12:29 PM

## 2018-01-26 NOTE — Progress Notes (Addendum)
D:  Patient's self inventory sheet, patient has poor sleep, sleep medication not helpful.  Good appetite, normal energy level, good concentration.  Rated depression 4, hopeless and anxiety 10.  Denied withdrawals.  Denied SI.  Physical problems, pain, arms, legs.  Pain medicine not helpful.  Medications for pain, sleep, anxiety.  Talk with MD.  Does have discharge plans. A:  Medications administered per MD orders.  Emotional support and encouragement given patient. R:  Denied SI and HI, contracts for safety.  Patient denied A/V hallucinations.  Safety maintained with 15 minute checks. Patient stayed in bed most of the day.
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
## 2018-01-26 NOTE — Plan of Care (Signed)
Nurse discussed depression, anxiety, coping skills with patient.  

## 2018-01-27 MED ORDER — BUSPIRONE HCL 15 MG PO TABS
15.0000 mg | ORAL_TABLET | Freq: Three times a day (TID) | ORAL | Status: DC
  Administered 2018-01-27 – 2018-01-30 (×8): 15 mg via ORAL
  Filled 2018-01-27 (×14): qty 1

## 2018-01-27 MED ORDER — QUETIAPINE FUMARATE 100 MG PO TABS
100.0000 mg | ORAL_TABLET | Freq: Every day | ORAL | Status: DC
  Administered 2018-01-27: 100 mg via ORAL
  Filled 2018-01-27 (×2): qty 1

## 2018-01-27 MED ORDER — BOOST / RESOURCE BREEZE PO LIQD CUSTOM
1.0000 | Freq: Three times a day (TID) | ORAL | Status: DC
  Administered 2018-01-27 – 2018-01-28 (×2): 1 via ORAL
  Filled 2018-01-27 (×15): qty 1

## 2018-01-27 MED ORDER — DICYCLOMINE HCL 20 MG PO TABS
20.0000 mg | ORAL_TABLET | Freq: Three times a day (TID) | ORAL | Status: DC
  Administered 2018-01-27 – 2018-01-30 (×8): 20 mg via ORAL
  Filled 2018-01-27 (×14): qty 1

## 2018-01-27 MED ORDER — TIZANIDINE HCL 4 MG PO TABS
8.0000 mg | ORAL_TABLET | Freq: Three times a day (TID) | ORAL | Status: DC
  Administered 2018-01-27 – 2018-01-30 (×8): 8 mg via ORAL
  Filled 2018-01-27 (×14): qty 2

## 2018-01-27 NOTE — Progress Notes (Signed)
D: Patient observed isolative to room, resting in bed this evening. Patient states she has had less nausea today with the adjustment to her zofran. Using self cath kits as needed. Patient's affect anxious with congruent mood. Pleasant and cooperative. Attended group on 400 hall.   A: Medicated per orders, prn trazadone and zofran given for her complaints. Medication education provided. Level III obs in place for safety. Emotional support offered. Patient encouraged to complete Suicide Safety Plan before discharge. Encouraged to attend and participate in unit programming.  Fall prevention plan in place and reviewed with patient as pt is a high fall risk due to hx of seizures.   R: Patient verbalizes understanding of POC, falls prevention education. On reassess, patient reported decreased nausea and trazadone was effective. Patient denies SI/HI/AVH and remains safe on level III obs. Will continue to monitor throughout the night.

## 2018-01-27 NOTE — Plan of Care (Signed)
D: Alexis Singleton denied SI/HI/AVH. She has struggled with withdrawal and chronic pain today. Her appetite has improved marginally. She was able to drink some supplements provided. She has remained positive and polite today. Her goals today included working on anxiety, coping skills, and pain management. She has attended groups. She reported having good sleep, low energy level, and good concentration. She rated her depression 3/10, hopelessness 3/10, and anxiety 7/10.   A: Meds given as ordered, including PRN Vistaril, Zofran, and Tylenol (see MAR), with minimal to partial relief. Q15 safety checks maintained. Support/encouragement offered.  R: Pt remains free from harm and continues with treatment. Will continue to monitor for needs/safety.   Problem: Education: Goal: Emotional status will improve Outcome: Progressing Goal: Mental status will improve Outcome: Progressing   Problem: Activity: Goal: Sleeping patterns will improve Outcome: Progressing   Problem: Coping: Goal: Ability to verbalize frustrations and anger appropriately will improve Outcome: Progressing Goal: Ability to demonstrate self-control will improve Outcome: Progressing   Problem: Health Behavior/Discharge Planning: Goal: Compliance with treatment plan for underlying cause of condition will improve Outcome: Progressing   Problem: Physical Regulation: Goal: Ability to maintain clinical measurements within normal limits will improve Outcome: Progressing   Problem: Education: Goal: Knowledge of the prescribed therapeutic regimen will improve Outcome: Progressing   Problem: Coping: Goal: Coping ability will improve Outcome: Progressing Goal: Will verbalize feelings Outcome: Progressing   Problem: Health Behavior/Discharge Planning: Goal: Compliance with therapeutic regimen will improve Outcome: Progressing   Problem: Self-Concept: Goal: Level of anxiety will decrease Outcome: Progressing

## 2018-01-27 NOTE — Plan of Care (Signed)
Patient has not engaged in self harm, denies thoughts to do so.  Patient is med compliant.

## 2018-01-27 NOTE — Progress Notes (Signed)
[facility] MD Progress Note  01/27/2018 1:09 PM Alexis Singleton  MRN:  [id number] Subjective: Patient is seen and examined.  Patient is a 37 year old female with a past psychiatric history significant for bipolar disorder type II, depression, anxiety, probable opiate dependence from long-term use of fentanyl as well as other opiates.  She is seen in follow-up.  She stated her mood is improving to a degree.  She stated her pain is worse.  A consultation with the hospitalist service for pain management was ordered yesterday.  She stated she was able to eat a bit today, but is concerned about not being able to eat the Glucerna supplements.  I discussed with her that we would talk to nutrition about that.  She continues with physical complaints over her fibromyalgia, headaches, cystitis as well as hypothyroidism.  It should be noted that on admission her TSH was within normal limits and at least she had reported not being on her Armour Thyroid for several months.  She denied suicidal ideation, and hopes that when she returns home she will do a better job with her self care.  Her blood pressure is stable, she is having intermittent periods of tachycardia but stable.  She has continued with nausea, but the medications appear to be controlling that.  The increased dose of the Seroquel did help her sleep and she slept 6.5 hours last night.  She did take the trazodone on top of that. Principal Problem: Bipolar 2 disorder, major depressive episode ([place]) Diagnosis:   Patient Active Problem List   Diagnosis Date Noted  . Bipolar 2 disorder, major depressive episode ([place]) [F31.81] 01/26/2018  . Major depressive disorder, recurrent severe without psychotic features ([place]) [F33.2] 01/24/2018  . GAD (generalized anxiety disorder) [F41.1] 01/24/2018  . Opiate withdrawal ([place]) [F11.23]   . Opioid dependence with withdrawal ([place]) [F11.23]   . Chronic back pain [M54.9, G89.29] 02/14/2014  . Chronic neck pain [M54.2, G89.29]  02/14/2014  . Fibromyalgia affecting multiple sites [M79.7] 02/14/2014  . Other chronic pain [G89.29] 02/14/2014   Total Time spent with patient: 30 minutes  Past Psychiatric History: See admission H&P  Past Medical History:  Past Medical History:  Diagnosis Date  . 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 Arthritis   . Breast cancer ([place])   . Chronic pain   . Fibromyalgia   . IC (interstitial cystitis)   . Ovarian cancer ([place])   . Pulmonary embolism ([place])   . Seizures ([place])   . Spinal compression fracture ([place])   . Thyroid cancer ([place])   . Thyroid cancer ([place])   . Uterine cancer [facility])     Past Surgical History:  Procedure Laterality Date  . ABDOMINAL EXPLORATION SURGERY    . ABDOMINAL HYSTERECTOMY    . APPENDECTOMY    . BREAST LUMPECTOMY    . CHOLECYSTECTOMY    . COMBINED AUGMENTATION MAMMAPLASTY AND ABDOMINOPLASTY    . GASTRIC BYPASS    . I&D EXTREMITY Left 01/18/2015   Procedure: IRRIGATION AND DEBRIDEMENT, PINNING  AND  REPAIR NAIL BED LEFT MIDDLE FINGER;  Surgeon: Leanora Cover, MD;  Location: [facility];  Service: Orthopedics;  Laterality: Left;  . SINUSOTOMY    . THYROIDECTOMY     Family History:  Family History  Problem Relation Age of Onset  . Alcohol abuse Father    Family Psychiatric  History: See admission H&P Social History:  Social History   Substance and Sexual Activity  Alcohol Use Yes   Comment: occassionally  Social History   Substance and Sexual Activity  Drug Use Yes  . Types: Marijuana    Social History   Socioeconomic History  . Marital status: Legally Separated    Spouse name: Not on file  . Number of children: Not on file  . Years of education: Not on file  . Highest education level: Not on file  Occupational History  . Not on file  Social Needs  . Financial resource strain: Not on file  . Food insecurity:    Worry: Not on file    Inability: Not on file  . Transportation needs:    Medical: Not on file    Non-medical: Not on  file  Tobacco Use  . Smoking status: Never Smoker  . Smokeless tobacco: Never Used  Substance and Sexual Activity  . Alcohol use: Yes    Comment: occassionally  . Drug use: Yes    Types: Marijuana  . Sexual activity: Not Currently  Lifestyle  . Physical activity:    Days per week: Not on file    Minutes per session: Not on file  . Stress: Not on file  Relationships  . Social connections:    Talks on phone: Not on file    Gets together: Not on file    Attends religious service: Not on file    Active member of club or organization: Not on file    Attends meetings of clubs or organizations: Not on file    Relationship status: Not on file  Other Topics Concern  . Not on file  Social History Narrative  . Not on file   Additional Social History:                         Sleep: Fair  Appetite:  Poor  Current Medications: Current Facility-Administered Medications  Medication Dose Route Frequency Provider Last Rate Last Dose  . acetaminophen (TYLENOL) tablet 650 mg  650 mg Oral Q6H PRN Sharma Covert, MD   650 mg at 01/27/18 1120  . alum & mag hydroxide-simeth (MAALOX/MYLANTA) 200-200-20 MG/5ML suspension 30 mL  30 mL Oral Q4H PRN Sharma Covert, MD      . busPIRone (BUSPAR) tablet 10 mg  10 mg Oral TID Benjamine Mola, FNP   10 mg at 01/27/18 1205  . cholecalciferol (VITAMIN D) tablet 400 Units  400 Units Oral Daily Sharma Covert, MD   400 Units at 01/27/18 [phone]  . cloNIDine (CATAPRES) tablet 0.1 mg  0.1 mg Oral BH-qamhs Money, Darnelle Maffucci B, FNP   0.1 mg at 01/27/18 1829   Followed by  . [START ON 01/29/2018] cloNIDine (CATAPRES) tablet 0.1 mg  0.1 mg Oral QAC breakfast Money, Darnelle Maffucci B, FNP      . darifenacin (ENABLEX) 24 hr tablet 15 mg  15 mg Oral Daily Sharma Covert, MD   15 mg at 01/27/18 9371  . dicyclomine (BENTYL) tablet 20 mg  20 mg Oral Q6H PRN Sharma Covert, MD      . DULoxetine (CYMBALTA) DR capsule 60 mg  60 mg Oral BID Sharma Covert,  MD   60 mg at 01/27/18 6967  . feeding supplement (GLUCERNA SHAKE) (GLUCERNA SHAKE) liquid 237 mL  237 mL Oral TID BM Sharma Covert, MD   237 mL at 89/38/10 1751  . folic acid (FOLVITE) tablet 1 mg  1 mg Oral Daily Sharma Covert, MD   1 mg at 01/27/18 0258  .  hydrOXYzine (ATARAX/VISTARIL) tablet 25 mg  25 mg Oral Q6H PRN Sharma Covert, MD   25 mg at 01/24/18 1818  . loperamide (IMODIUM) capsule 2-4 mg  2-4 mg Oral PRN Sharma Covert, MD      . magnesium hydroxide (MILK OF MAGNESIA) suspension 30 mL  30 mL Oral Daily PRN Sharma Covert, MD      . multivitamin (PROSIGHT) tablet 1 tablet  1 tablet Oral Daily Sharma Covert, MD   1 tablet at 01/27/18 [phone]  . ondansetron (ZOFRAN-ODT) disintegrating tablet 8 mg  8 mg Oral Q8H PRN Benjamine Mola, FNP   8 mg at 01/27/18 0701  . pantoprazole (PROTONIX) EC tablet 40 mg  40 mg Oral Daily Sharma Covert, MD   40 mg at 01/27/18 8756  . QUEtiapine (SEROQUEL) tablet 100 mg  100 mg Oral QHS Sharma Covert, MD      . SUMAtriptan [facility]) tablet 50 mg  50 mg Oral Q2H PRN Sharma Covert, MD      . tiZANidine (ZANAFLEX) tablet 8 mg  8 mg Oral TID Sharma Covert, MD      . traZODone (DESYREL) tablet 50 mg  50 mg Oral QHS PRN Benjamine Mola, FNP   50 mg at 01/26/18 2110  . trimethoprim (TRIMPEX) tablet 100 mg  100 mg Oral Daily Sharma Covert, MD   100 mg at 01/27/18 4332    Lab Results:  Results for orders placed or performed during the hospital encounter of 01/24/18 (from the past 48 hour(s))  TSH     Status: None   Collection Time: 01/26/18  6:23 AM  Result Value Ref Range   TSH 3.199 0.350 - 4.500 uIU/mL    Comment: Performed by a 3rd Generation assay with a functional sensitivity of <=0.01 uIU/mL. Performed at [facility], [place] [address], [place], [place] [address]   T4, free     Status: None   Collection Time: 01/26/18  6:23 AM  Result Value Ref Range   Free T4 0.88 0.82 - 1.77 ng/dL     Comment: (NOTE) Biotin ingestion may interfere with free T4 tests. If the results are inconsistent with the TSH level, previous test results, or the clinical presentation, then consider biotin interference. If needed, order repeat testing after stopping biotin. Performed at [facility], [place] [address] [place], [place] [address]     Blood Alcohol level:  Lab Results  Component Value Date   ETH <10 63/07/6008    Metabolic Disorder Labs: No results found for: HGBA1C, MPG No results found for: PROLACTIN No results found for: CHOL, TRIG, HDL, CHOLHDL, VLDL, LDLCALC  Physical Findings: AIMS: Facial and Oral Movements Muscles of Facial Expression: None, normal Lips and Perioral Area: None, normal Jaw: None, normal Tongue: None, normal,Extremity Movements Upper (arms, wrists, hands, fingers): None, normal Lower (legs, knees, ankles, toes): None, normal, Trunk Movements Neck, shoulders, hips: None, normal, Overall Severity Severity of abnormal movements (highest score from questions above): None, normal Incapacitation due to abnormal movements: None, normal Patient's awareness of abnormal movements (rate only patient's report): No Awareness, Dental Status Current problems with teeth and/or dentures?: No Does patient usually wear dentures?: No  CIWA:  CIWA-Ar Total: 2 COWS:  COWS Total Score: 8  Musculoskeletal: Strength & Muscle Tone: within normal limits Gait & Station: normal Patient leans: N/A  Psychiatric Specialty Exam: Physical Exam  Nursing note and vitals reviewed. Constitutional: She is oriented to  person, place, and time. She appears well-developed and well-nourished.  HENT:  Head: Normocephalic and atraumatic.  Respiratory: Effort normal.  Neurological: She is alert and oriented to person, place, and time.    ROS  Blood pressure 115/70, pulse (!) 119, temperature 98.9 F (37.2 C), temperature source Oral, resp. rate 20, height 5\' 6"  (1.676 m),  weight 56.7 kg (125 lb).Body mass index is 20.18 kg/m.  General Appearance: Casual  Eye Contact:  Fair  Speech:  Normal Rate  Volume:  Normal  Mood:  Anxious and Dysphoric  Affect:  Congruent  Thought Process:  Coherent  Orientation:  Full (Time, Place, and Person)  Thought Content:  Logical  Suicidal Thoughts:  No  Homicidal Thoughts:  No  Memory:  Immediate;   Fair Recent;   Fair Remote;   Fair  Judgement:  Intact  Insight:  Fair  Psychomotor Activity:  Increased and Restlessness  Concentration:  Concentration: Fair and Attention Span: Fair  Recall:  [employer] of Knowledge:  Fair  Language:  Good  Akathisia:  Negative  Handed:  Right  AIMS (if indicated):     Assets:  Communication Skills Desire for Improvement Housing Resilience Social Support  ADL's:  Intact  Cognition:  WNL  Sleep:  Number of Hours: 6.5     Treatment Plan Summary: Daily contact with patient to assess and evaluate symptoms and progress in treatment, Medication management and Plan Patient is seen and examined.  Patient is a 37 year old female with the above-stated past psychiatric history who is seen in follow-up.  She is slowly improving.  I would like to consolidate her medicines at bedtime, so I am going to increase her Seroquel 200 mg p.o. nightly.  She will still have the trazodone available as a as needed.  We will continue the Cymbalta on its current dosage.  We will titrate the BuSpar dosage up today for anxiety.  Her appetite is still poor, and she continues to have nausea.  We will continue the Zofran for that.  I will discuss with nutrition the possibility of changing 1 of her supplements.  She continues self-catheterization for her interstitial cystitis.  As stated above her hypothyroidism by report is stable with a normal TSH given the fact that she is been without medicine for several months.  She is not on the Armour Thyroid currently.  She continues on the clonidine detox protocol.  There has  been a consultation requested for pain management.  We will see what the recommendations are.  Sharma Covert, MD 01/27/2018, 1:09 PM

## 2018-01-27 NOTE — Progress Notes (Signed)
Pt did not attend goals and orientation group this morning  

## 2018-01-27 NOTE — Progress Notes (Signed)
Recreation Therapy Notes  Date: 7.5.19 Time: 0930 Location: 300 Hall Dayroom  Group Topic: Stress Management  Goal Area(s) Addresses:  Patient will verbalize importance of using healthy stress management.  Patient will identify positive emotions associated with healthy stress management.   Intervention: Stress Management  Activity :  Meditation.  LRT introduced the stress management technique of meditation.  LRT played a meditation on resilience that allowed patients to focus on being able to withstand obstacles that arise.  Patients were to listen and follow along as meditation played to engage in the activity.  Education:  Stress Management, Discharge Planning.   Education Outcome: Acknowledges edcuation/In group clarification offered/Needs additional education  Clinical Observations/Feedback: Pt did not attend group.     Victorino Sparrow, LRT/CTRS         Ria Comment, Emberly Tomasso A 01/27/2018 11:12 AM

## 2018-01-27 NOTE — BHH Group Notes (Signed)
[facility] Group Notes:  (Nursing/MHT/Case Management/Adjunct)  Date:  01/27/2018  Time:  4:00 pm  Type of Therapy:  Nurse Education  Participation Level:  Active  Participation Quality:  Appropriate and Attentive  Affect:  Appropriate  Cognitive:  Alert, Appropriate and Oriented  Insight:  Appropriate and Good  Engagement in Group:  Developing/Improving and Engaged  Modes of Intervention:  Activity, Education and Problem-solving  Summary of Progress/Problems: pt attentive and engaged in group. Pt asked questions and was appropriate.   Otelia Limes Mckinzey Entwistle 01/27/2018, 6:01 PM

## 2018-01-27 NOTE — Progress Notes (Signed)
Adult Psychoeducational Group Note  Date:  01/27/2018 Time:  10:04 PM  Group Topic/Focus:  Wrap-Up Group:   The focus of this group is to help patients review their daily goal of treatment and discuss progress on daily workbooks.  Participation Level:  Active  Participation Quality:  Appropriate  Affect:  Appropriate  Cognitive:  Appropriate  Insight: Appropriate  Engagement in Group:  Engaged  Modes of Intervention:  Discussion  Additional Comments:  Pt expressed that chronic pain has been a concern but he depression has lessened since she was admitted.  Pt rated her day at 5/10.  Makaveli Hoard 01/27/2018, 10:04 PM

## 2018-01-28 MED ORDER — OXCARBAZEPINE 150 MG PO TABS
150.0000 mg | ORAL_TABLET | Freq: Two times a day (BID) | ORAL | Status: DC
  Administered 2018-01-28 – 2018-01-29 (×2): 150 mg via ORAL
  Filled 2018-01-28 (×4): qty 1

## 2018-01-28 MED ORDER — QUETIAPINE FUMARATE 200 MG PO TABS
200.0000 mg | ORAL_TABLET | Freq: Every day | ORAL | Status: DC
  Administered 2018-01-28 – 2018-01-29 (×2): 200 mg via ORAL
  Filled 2018-01-28 (×4): qty 1

## 2018-01-28 NOTE — BHH Group Notes (Signed)
LCSW Group Therapy Note  01/28/2018   10:00--11:00am   Type of Therapy and Topic:  Group Therapy: Anger Cues and Responses  Participation Level:  Did Not Attend   Description of Group:   In this group, patients learned how to recognize the physical, cognitive, emotional, and behavioral responses they have to anger-provoking situations.  They identified a recent time they became angry and how they reacted.  They analyzed how their reaction was possibly beneficial and how it was possibly unhelpful.  The group discussed a variety of healthier coping skills that could help with such a situation in the future.  Deep breathing was practiced briefly.  Therapeutic Goals: 1. Patients will remember their last incident of anger and how they felt emotionally and physically, what their thoughts were at the time, and how they behaved. 2. Patients will identify how their behavior at that time worked for them, as well as how it worked against them. 3. Patients will explore possible new behaviors to use in future anger situations. 4. Patients will learn that anger itself is normal and cannot be eliminated, and that healthier reactions can assist with resolving conflict rather than worsening situations.  Summary of Patient Progress:     Therapeutic Modalities:   Cognitive Behavioral Therapy  Rolanda Jay

## 2018-01-28 NOTE — Progress Notes (Signed)
D Patient is observed OOB UAL on the 400 hall today. She tolerates this well. She makes direct eye contact with this Probation officer. Her affect is pleasant. She smiles brightly and she is knowledgeable and articulate in her explaining her disease process and her version of why she decompensatied " I'm always taking care of everybody else".    A She completed her daily assessment and on this she wrote she deneid SI today and she rated her depression, hopelessness and anxiety " 4/3/5", respectively. She attends her groups, she is engaged in her problems and wants to speak to the M about her pain management meds ASAP.    R Safety is in place and poc cont.

## 2018-01-28 NOTE — Progress Notes (Signed)
[facility] MD Progress Note  01/28/2018 11:43 AM Tamecka Milham  MRN:  [id number] Subjective: Patient is seen and examined.  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  She is seen in follow-up.  She stated her pain is worse again today.  She is waiting for the hospitalist service come see her for pain management.  I inquired as to whether or not she wanted to go back on her narcotics.  She is unsure of this.  I told her that that would take a backseat to all the progress she made getting off those medicines.  She stated she is been on multiple medications for the pain in the past.  We discussed possibly trying Trileptal.  She stated her mood was doing better.  She stated she was tolerating the medicines.  She did state that the Seroquel was helping her sleep, but she was still requiring trazodone.  We discussed the possibility of increasing her Seroquel so that the trazodone might not be necessary. Principal Problem: Bipolar 2 disorder, major depressive episode ([place]) Diagnosis:   Patient Active Problem List   Diagnosis Date Noted  . Bipolar 2 disorder, major depressive episode ([place]) [F31.81] 01/26/2018  . Major depressive disorder, recurrent severe without psychotic features ([place]) [F33.2] 01/24/2018  . GAD (generalized anxiety disorder) [F41.1] 01/24/2018  . Opiate withdrawal ([place]) [F11.23]   . Opioid dependence with withdrawal ([place]) [F11.23]   . Chronic back pain [M54.9, G89.29] 02/14/2014  . Chronic neck pain [M54.2, G89.29] 02/14/2014  . Fibromyalgia affecting multiple sites [M79.7] 02/14/2014  . Other chronic pain [G89.29] 02/14/2014   Total Time spent with patient: 20 minutes  Past Psychiatric History: See admission H&P  Past Medical History:  Past Medical History:  Diagnosis  Date  . 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 Arthritis   . Breast cancer ([place])   . Chronic pain   . Fibromyalgia   . IC (interstitial cystitis)   . Ovarian cancer ([place])   . Pulmonary embolism ([place])   . Seizures ([place])   . Spinal compression fracture ([place])   . Thyroid cancer ([place])   . Thyroid cancer ([place])   . Uterine cancer [facility])     Past Surgical History:  Procedure Laterality Date  . ABDOMINAL EXPLORATION SURGERY    . ABDOMINAL HYSTERECTOMY    . APPENDECTOMY    . BREAST LUMPECTOMY    . CHOLECYSTECTOMY    . COMBINED AUGMENTATION MAMMAPLASTY AND ABDOMINOPLASTY    . GASTRIC BYPASS    . I&D EXTREMITY Left 01/18/2015   Procedure: IRRIGATION AND DEBRIDEMENT, PINNING  AND  REPAIR NAIL BED LEFT MIDDLE FINGER;  Surgeon: Leanora Cover, MD;  Location: [place];  Service: Orthopedics;  Laterality: Left;  . SINUSOTOMY    . THYROIDECTOMY     Family History:  Family History  Problem Relation Age of Onset  . Alcohol abuse Father    Family Psychiatric  History: See admission H&P Social History:  Social History   Substance and Sexual Activity  Alcohol Use Yes   Comment: occassionally     Social History   Substance and Sexual Activity  Drug Use Yes  . Types: Marijuana    Social History   Socioeconomic History  . Marital status: Legally Separated    Spouse name: Not on file  . Number of  children: Not on file  . Years of education: Not on file  . Highest education level: Not on file  Occupational History  . Not on file  Social Needs  . Financial resource strain: Not on file  . Food insecurity:    Worry: Not on file    Inability: Not on file  . Transportation needs:    Medical: Not on file    Non-medical: Not on file  Tobacco Use  . Smoking status: Never Smoker  . Smokeless tobacco: Never Used  Substance and Sexual Activity  . Alcohol use: Yes    Comment: occassionally  . Drug use: Yes    Types: Marijuana  . Sexual activity: Not Currently  Lifestyle  . Physical  activity:    Days per week: Not on file    Minutes per session: Not on file  . Stress: Not on file  Relationships  . Social connections:    Talks on phone: Not on file    Gets together: Not on file    Attends religious service: Not on file    Active member of club or organization: Not on file    Attends meetings of clubs or organizations: Not on file    Relationship status: Not on file  Other Topics Concern  . Not on file  Social History Narrative  . Not on file   Additional Social History:                         Sleep: Fair  Appetite:  Fair  Current Medications: Current Facility-Administered Medications  Medication Dose Route Frequency Provider Last Rate Last Dose  . acetaminophen (TYLENOL) tablet 650 mg  650 mg Oral Q6H PRN Sharma Covert, MD   650 mg at 01/27/18 1120  . alum & mag hydroxide-simeth (MAALOX/MYLANTA) 200-200-20 MG/5ML suspension 30 mL  30 mL Oral Q4H PRN Sharma Covert, MD      . busPIRone (BUSPAR) tablet 15 mg  15 mg Oral TID Sharma Covert, MD   15 mg at 01/28/18 0842  . cholecalciferol (VITAMIN D) tablet 400 Units  400 Units Oral Daily Sharma Covert, MD   400 Units at 01/27/18 [phone]  . cloNIDine (CATAPRES) tablet 0.1 mg  0.1 mg Oral BH-qamhs Money, Darnelle Maffucci B, FNP   0.1 mg at 01/27/18 2137   Followed by  . [START ON 01/29/2018] cloNIDine (CATAPRES) tablet 0.1 mg  0.1 mg Oral QAC breakfast Money, Darnelle Maffucci B, FNP      . darifenacin (ENABLEX) 24 hr tablet 15 mg  15 mg Oral Daily Sharma Covert, MD   15 mg at 01/27/18 0086  . dicyclomine (BENTYL) tablet 20 mg  20 mg Oral TID AC Sharma Covert, MD   20 mg at 01/28/18 7619  . DULoxetine (CYMBALTA) DR capsule 60 mg  60 mg Oral BID Sharma Covert, MD   60 mg at 01/28/18 0844  . feeding supplement (BOOST / RESOURCE BREEZE) liquid 1 Container  1 Container Oral TID BM Sharma Covert, MD   1 Container at 01/27/18 1441  . folic acid (FOLVITE) tablet 1 mg  1 mg Oral Daily Sharma Covert, MD   1 mg at 01/27/18 5093  . hydrOXYzine (ATARAX/VISTARIL) tablet 25 mg  25 mg Oral Q6H PRN Sharma Covert, MD   25 mg at 01/27/18 1441  . loperamide (IMODIUM) capsule 2-4 mg  2-4 mg Oral PRN Sharma Covert, MD      .  magnesium hydroxide (MILK OF MAGNESIA) suspension 30 mL  30 mL Oral Daily PRN Sharma Covert, MD      . multivitamin (PROSIGHT) tablet 1 tablet  1 tablet Oral Daily Sharma Covert, MD   1 tablet at 01/27/18 [phone]  . ondansetron (ZOFRAN-ODT) disintegrating tablet 8 mg  8 mg Oral Q8H PRN Benjamine Mola, FNP   8 mg at 01/28/18 7017  . OXcarbazepine (TRILEPTAL) tablet 150 mg  150 mg Oral BID Sharma Covert, MD      . pantoprazole (PROTONIX) EC tablet 40 mg  40 mg Oral Daily Sharma Covert, MD   40 mg at 01/28/18 0844  . QUEtiapine (SEROQUEL) tablet 200 mg  200 mg Oral QHS Sharma Covert, MD      . SUMAtriptan [facility]) tablet 50 mg  50 mg Oral Q2H PRN Sharma Covert, MD      . tiZANidine (ZANAFLEX) tablet 8 mg  8 mg Oral TID Sharma Covert, MD   8 mg at 01/28/18 [phone]  . traZODone (DESYREL) tablet 50 mg  50 mg Oral QHS PRN Benjamine Mola, FNP   50 mg at 01/27/18 2137  . trimethoprim (TRIMPEX) tablet 100 mg  100 mg Oral Daily Sharma Covert, MD   100 mg at 01/27/18 0300    Lab Results: No results found for this or any previous visit (from the past 21 hour(s)).  Blood Alcohol level:  Lab Results  Component Value Date   ETH <10 92/33/0076    Metabolic Disorder Labs: No results found for: HGBA1C, MPG No results found for: PROLACTIN No results found for: CHOL, TRIG, HDL, CHOLHDL, VLDL, LDLCALC  Physical Findings: AIMS: Facial and Oral Movements Muscles of Facial Expression: None, normal Lips and Perioral Area: None, normal Jaw: None, normal Tongue: None, normal,Extremity Movements Upper (arms, wrists, hands, fingers): None, normal Lower (legs, knees, ankles, toes): None, normal, Trunk Movements Neck, shoulders, hips: None,  normal, Overall Severity Severity of abnormal movements (highest score from questions above): None, normal Incapacitation due to abnormal movements: None, normal Patient's awareness of abnormal movements (rate only patient's report): No Awareness, Dental Status Current problems with teeth and/or dentures?: No Does patient usually wear dentures?: No  CIWA:  CIWA-Ar Total: 2 COWS:  COWS Total Score: 2  Musculoskeletal: Strength & Muscle Tone: within normal limits Gait & Station: normal Patient leans: N/A  Psychiatric Specialty Exam: Physical Exam  Nursing note and vitals reviewed. Constitutional: She is oriented to person, place, and time. She appears well-developed and well-nourished.  HENT:  Head: Normocephalic and atraumatic.  Respiratory: Effort normal.  Neurological: She is alert and oriented to person, place, and time.    ROS  Blood pressure (!) 90/58, pulse (!) 138, temperature 98.2 F (36.8 C), temperature source Oral, resp. rate 20, height 5\' 6"  (1.676 m), weight 56.7 kg (125 lb).Body mass index is 20.18 kg/m.  General Appearance: Casual  Eye Contact:  Fair  Speech:  Normal Rate  Volume:  Normal  Mood:  Dysphoric  Affect:  Congruent  Thought Process:  Coherent  Orientation:  Full (Time, Place, and Person)  Thought Content:  Logical  Suicidal Thoughts:  No  Homicidal Thoughts:  No  Memory:  Immediate;   Fair Recent;   Fair Remote;   Fair  Judgement:  Fair  Insight:  Lacking  Psychomotor Activity:  Increased and Restlessness  Concentration:  Concentration: Fair and Attention Span: Fair  Recall:  [employer] of Knowledge:  Fair  Language:  Good  Akathisia:  Negative  Handed:  Right  AIMS (if indicated):     Assets:  Desire for Improvement Housing Resilience Social Support  ADL's:  Intact  Cognition:  WNL  Sleep:  Number of Hours: 6     Treatment Plan Summary: Daily contact with patient to assess and evaluate symptoms and progress in treatment,  Medication management and Plan Patient is seen and examined.  Patient is a 37 year old female with the above-stated past psychiatric history seen in follow-up.  From a psychiatric perspective she still continues to improve to a degree, but continues to complain of chronic pain issues.  I will add Trileptal 150 mg p.o. twice daily for chronic pain as well as anxiety.  I will increase her Seroquel 200 mg p.o. nightly to try and get away from having to take the trazodone as well.  She continues on duloxetine.  She also continues on Zanaflex.  She is awaiting medicine consult for pain management, we will just have to wait and see.  Sharma Covert, MD 01/28/2018, 11:43 AM

## 2018-01-28 NOTE — BHH Suicide Risk Assessment (Signed)
[place] INPATIENT:  Family/Significant Other Suicide Prevention Education  Suicide Prevention Education:  Education Completed; Doren Custard - fiance,  (name of family member/significant other) has been identified by the patient as the family member/significant other with whom the patient will be residing, and identified as the person(s) who will aid the patient in the event of a mental health crisis (suicidal ideations/suicide attempt).  With written consent from the patient, the family member/significant other has been provided the following suicide prevention education, prior to the and/or following the discharge of the patient.  The suicide prevention education provided includes the following:  Suicide risk factors  Suicide prevention and interventions  National Suicide Hotline telephone number  [facility] assessment telephone number  [facility] Emergency Assistance [place] and/or Residential Mobile Crisis Unit telephone number  Request made of family/significant other to:  Remove weapons (e.g., guns, rifles, knives), all items previously/currently identified as safety concern.    Remove drugs/medications (over-the-counter, prescriptions, illicit drugs), all items previously/currently identified as a safety concern.  The family member/significant other verbalizes understanding of the suicide prevention education information provided.  The family member/significant other agrees to remove the items of safety concern listed above. Fiance stated he is familiar with mental health and worked in the field for 7-8 years. Fiance reports there are no firearms or weapons in the home other then the kitchen knives.   Tye Savoy 01/28/2018, 4:25 PM
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
## 2018-01-28 NOTE — Progress Notes (Signed)
D:  Alexis Singleton continues to remain in her room but was talking with her roommate upon initial approach.  She attended evening group.  She continues to worry about her pain management but is following her current treatment plan.  She denied SI/HI or A/V hallucinations.  She continues to report anxiety and trying to work on her coping skills so she is able to take care of herself mentally when she is home.  She reported sleeping pretty good last night and is looking forward to increase in Seroquel this evening.  Provided self cath kit prn.   A:  1:1 with RN for support and encouragement.  Medications as ordered.  Q 15 minute checks maintained for safety.  Encouraged participation in group and unit activities.   R:  Alexis Singleton remains safe on the unit.  We will continue to monitor the progress towards her goals.

## 2018-01-28 NOTE — Plan of Care (Signed)
  Problem: Education: Goal: Mental status will improve Outcome: Progressing   

## 2018-01-28 NOTE — Plan of Care (Signed)
Problem: Self-Concept: Goal: Will verbalize positive feelings about self Outcome: Progressing   An reported that she is trying to work on taking care of herself so she will be able to help other's.  We will continue to monitor the progress towards her goals.

## 2018-01-28 NOTE — BHH Group Notes (Addendum)
[place] Group Notes:  (Nursing)  Date:  01/28/2018  Time:  1:15 PM Type of Therapy:  Nurse Education  Participation Level:  Active  Participation Quality:  Appropriate and Attentive  Affect:  Appropriate  Cognitive:  Appropriate  Insight:  Appropriate  Engagement in Group:  Engaged  Modes of Intervention:  Discussion, Education and Exploration  Summary:  Nurse led group: Identifying Needs  Waymond Cera 01/28/2018, 3:54 PM

## 2018-01-28 NOTE — Progress Notes (Signed)
Adult Psychoeducational Group Note  Date:  01/28/2018 Time:  8:56 PM  Group Topic/Focus:  Wrap-Up Group:   The focus of this group is to help patients review their daily goal of treatment and discuss progress on daily workbooks.  Participation Level:  Active  Participation Quality:  Appropriate  Affect:  Appropriate  Cognitive:  Appropriate  Insight: Appropriate  Engagement in Group:  Engaged  Modes of Intervention:  Discussion  Additional Comments:  Patient attended wrap-up group and participated.   Alexis Singleton W Reyes [place] 4/0/3474, 8:56 PM

## 2018-01-28 NOTE — Progress Notes (Signed)
Pt returned from breakfast early and was vomiting.  She also was complaining of diarrhea.  She was given zofran earlier with no relief.  She declined needing anything at this time.

## 2018-01-29 MED ORDER — OXYCODONE HCL 5 MG PO TABS
5.0000 mg | ORAL_TABLET | Freq: Four times a day (QID) | ORAL | Status: DC | PRN
  Administered 2018-01-29 – 2018-01-30 (×3): 5 mg via ORAL
  Filled 2018-01-29 (×3): qty 1

## 2018-01-29 MED ORDER — OXCARBAZEPINE 300 MG PO TABS
300.0000 mg | ORAL_TABLET | Freq: Two times a day (BID) | ORAL | Status: DC
  Administered 2018-01-29 – 2018-01-30 (×2): 300 mg via ORAL
  Filled 2018-01-29 (×6): qty 1

## 2018-01-29 NOTE — Progress Notes (Signed)
D Pt is observed OOB UAL on the 400 hall this am. She is cooperative, pleasant chats with Probation officer and attempts to make small talk as Probation officer administers her morning meds. SHe shares that she is " working on my partner...trying to help him see how he, too, can make changes to help himself. ]    A She did complete her daily assessment and on it she wrote she denied SI today and she rated her depression, hopelessness and anxiety " 3/4/3",respectively. She spke with Dr. Mallie Darting and roxicodone q6' was started for her ongoing pain. She received this at 1315 per MD order. She cont to self I & O cath as she needs to . She reports she is highly skilled in this and requires no assisstance.      R Safey is in place and poc cont.

## 2018-01-29 NOTE — BHH Group Notes (Signed)
[facility] LCSW Group Therapy Note  Date/Time:  01/29/2018 10:00-11:00AM  Type of Therapy and Topic:  Group Therapy:  Healthy and Unhealthy Supports  Participation Level:  Active   Description of Group:  Patients in this group were introduced to the idea of adding a variety of healthy supports to address the various needs in their lives.Patients discussed what additional healthy supports could be helpful in their recovery and wellness after discharge in order to prevent future hospitalizations.   An emphasis was placed on using counselor, doctor, therapy groups, 12-step groups, and problem-specific support groups to expand supports.  They also worked as a group on developing a specific plan for several patients to deal with unhealthy supports through [place], psychoeducation with loved ones, and even termination of relationships.   Therapeutic Goals:   1)  discuss importance of adding supports to stay well once out of the hospital  2)  compare healthy versus unhealthy supports and identify some examples of each  3)  generate ideas and descriptions of healthy supports that can be added  4)  offer mutual support about how to address unhealthy supports  5)  encourage active participation in and adherence to discharge plan    Summary of Patient Progress:  The patient stated that she is not lacking in support but has now allowed people in her life to be supportive. She attributed it to a series of losses in her life, past trauma and bad relationships. The patient recognizes the importance of utilizing a variety of supports to aid in her recovery journal. She was active in group and made multiple insightful comments about herself and to other members in group.   Therapeutic Modalities:   Motivational Interviewing Brief Solution-Focused Therapy  Elisabeth Pigeon, LCSW  Rolanda Jay

## 2018-01-29 NOTE — Plan of Care (Signed)
  Problem: Education: Goal: Mental status will improve Outcome: Progressing   

## 2018-01-29 NOTE — Progress Notes (Signed)
Adult Psychoeducational Group Note  Date:  01/29/2018 Time:  10:17 AM  Group Topic/Focus:  Healthy Support Systems  Participation Level:  Active  Participation Quality:  Appropriate  Affect:  Appropriate  Cognitive:  Appropriate  Insight: Appropriate  Engagement in Group:  Engaged  Modes of Intervention:  Activity and Discussion  Additional Comments:  Pt was engaged and appropriate in group. Pt received a handout of "The 5 Love Languages" quiz. Pt completed quiz and discussed results with group. Pt stated that her goal was to prepare for discharge.  Oval Linsey 01/29/2018, 10:17 AM

## 2018-01-29 NOTE — Plan of Care (Signed)
D: Pt denies SI/HI/AVH. Pt is pleasant and cooperative. Pt stated she was having issues with her pain. Pt stated when the pain increases her depression and anxiety increase and also increase her SI.   A: Pt was offered support and encouragement. Pt was given scheduled medications. Pt was encourage to attend groups. Q 15 minute checks were done for safety.   R:Pt attends groups and interacts well with peers and staff. Pt is taking medication. Pt has no complaints.Pt receptive to treatment and safety maintained on unit.   Problem: Education: Goal: Emotional status will improve Outcome: Progressing   Problem: Education: Goal: Mental status will improve Outcome: Progressing   Problem: Activity: Goal: Interest or engagement in activities will improve Outcome: Progressing   Problem: Health Behavior/Discharge Planning: Goal: Compliance with treatment plan for underlying cause of condition will improve Outcome: Progressing

## 2018-01-29 NOTE — Progress Notes (Signed)
Pt was observed in the dayroom, seen interacting with peers. Pt attended wrap-up group this evening. Pt appears animated/anxious in affect and mood. Pt denies SI/HI/AVH at this time. Rates pain 4/10. Pt c/o of nausea this evening. Pt states she is hoping to be discharge tomorrow. PRN trazodone and Zofran requested and given. Will continue with POC.

## 2018-01-29 NOTE — Progress Notes (Signed)
[facility] MD Progress Note  01/29/2018 1:35 PM Alexis Singleton  MRN:  [id number] Subjective: Patient is seen and examined.  Patient is a 37 year old female with a past psychiatric history significant for bipolar type II, depression, anxiety, probable opiate dependence from long-term use of fentanyl for chronic pain, and some degree of personality disorder.  She is seen in follow-up.  She continues to complain of pain issues first today.  We had a discussion about that.  She was curious when the medicine consultation team would be here, and I voiced my knowledge that I was unsure when.  We discussed her pain medicines.  From the beginning I gave her the option of continuing pain medications are coming off of them, and I reminded her that she wanted to come off of them.  She stated that her pain is getting in the way with her treatment.  We discussed the possibility of going back on the oral pain medicines.  I suspected that even if we had weaned her completely off of it when she followed up with her neurologist who is writing the pain medicines that she would probably get a prescription.  I agreed to restart the oxycodone 5 mg p.o. every 6 hours as needed pain.  We discussed increasing the Trileptal as well.  It should be noted that the Trileptal is being used as a mood stabilizer and an anxiety reducer and not necessarily as a antiseizure medication.  She still stated that she has a seizure disorder, but as my review of the chart showed before there was no mention of a seizure disorder in any of the neurology notes.  She denied any suicidal ideation to day.  She denied any side effects of her current medications. Principal Problem: Bipolar 2 disorder, major depressive episode ([place]) Diagnosis:   Patient Active Problem List   Diagnosis Date Noted  . Bipolar 2 disorder, major depressive episode ([place]) [F31.81] 01/26/2018  . Major depressive disorder, recurrent severe without psychotic features ([place]) [F33.2] 01/24/2018   . GAD (generalized anxiety disorder) [F41.1] 01/24/2018  . Opiate withdrawal ([place]) [F11.23]   . Opioid dependence with withdrawal ([place]) [F11.23]   . Chronic back pain [M54.9, G89.29] 02/14/2014  . Chronic neck pain [M54.2, G89.29] 02/14/2014  . Fibromyalgia affecting multiple sites [M79.7] 02/14/2014  . Other chronic pain [G89.29] 02/14/2014   Total Time spent with patient: 30 minutes  Past Psychiatric History: See admission H&P  Past Medical History:  Past Medical History:  Diagnosis Date  . 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 Arthritis   . Breast cancer ([place])   . Chronic pain   . Fibromyalgia   . IC (interstitial cystitis)   . Ovarian cancer ([place])   . Pulmonary embolism ([place])   . Seizures ([place])   . Spinal compression fracture ([place])   . Thyroid cancer ([place])   . Thyroid cancer ([place])   . Uterine cancer [facility])     Past Surgical History:  Procedure Laterality Date  . ABDOMINAL EXPLORATION SURGERY    . ABDOMINAL HYSTERECTOMY    . APPENDECTOMY    . BREAST LUMPECTOMY    . CHOLECYSTECTOMY    . COMBINED AUGMENTATION MAMMAPLASTY AND ABDOMINOPLASTY    . GASTRIC BYPASS    . I&D EXTREMITY Left 01/18/2015   Procedure: IRRIGATION AND DEBRIDEMENT, PINNING  AND  REPAIR NAIL BED LEFT MIDDLE FINGER;  Surgeon: Leanora Cover, MD;  Location: [facility];  Service: Orthopedics;  Laterality: Left;  . SINUSOTOMY    . THYROIDECTOMY  Family History:  Family History  Problem Relation Age of Onset  . Alcohol abuse Father    Family Psychiatric  History: See admission H&P Social History:  Social History   Substance and Sexual Activity  Alcohol Use Yes   Comment: occassionally     Social History   Substance and Sexual Activity  Drug Use Yes  . Types: Marijuana    Social History   Socioeconomic History  . Marital status: Legally Separated    Spouse name: Not on file  . Number of children: Not on file  . Years of education: Not on file  . Highest education level: Not on file   Occupational History  . Not on file  Social Needs  . Financial resource strain: Not on file  . Food insecurity:    Worry: Not on file    Inability: Not on file  . Transportation needs:    Medical: Not on file    Non-medical: Not on file  Tobacco Use  . Smoking status: Never Smoker  . Smokeless tobacco: Never Used  Substance and Sexual Activity  . Alcohol use: Yes    Comment: occassionally  . Drug use: Yes    Types: Marijuana  . Sexual activity: Not Currently  Lifestyle  . Physical activity:    Days per week: Not on file    Minutes per session: Not on file  . Stress: Not on file  Relationships  . Social connections:    Talks on phone: Not on file    Gets together: Not on file    Attends religious service: Not on file    Active member of club or organization: Not on file    Attends meetings of clubs or organizations: Not on file    Relationship status: Not on file  Other Topics Concern  . Not on file  Social History Narrative  . Not on file   Additional Social History:                         Sleep: Fair  Appetite:  Fair  Current Medications: Current Facility-Administered Medications  Medication Dose Route Frequency Provider Last Rate Last Dose  . acetaminophen (TYLENOL) tablet 650 mg  650 mg Oral Q6H PRN Sharma Covert, MD   650 mg at 01/27/18 1120  . alum & mag hydroxide-simeth (MAALOX/MYLANTA) 200-200-20 MG/5ML suspension 30 mL  30 mL Oral Q4H PRN Sharma Covert, MD      . busPIRone (BUSPAR) tablet 15 mg  15 mg Oral TID Sharma Covert, MD   15 mg at 01/29/18 1312  . cholecalciferol (VITAMIN D) tablet 400 Units  400 Units Oral Daily Sharma Covert, MD   400 Units at 01/29/18 [phone]  . cloNIDine (CATAPRES) tablet 0.1 mg  0.1 mg Oral QAC breakfast Money, Darnelle Maffucci B, FNP   0.1 mg at 01/29/18 1008  . darifenacin (ENABLEX) 24 hr tablet 15 mg  15 mg Oral Daily Sharma Covert, MD   15 mg at 01/29/18 0759  . dicyclomine (BENTYL) tablet 20 mg   20 mg Oral TID AC Sharma Covert, MD   20 mg at 01/29/18 1312  . DULoxetine (CYMBALTA) DR capsule 60 mg  60 mg Oral BID Sharma Covert, MD   60 mg at 01/29/18 0758  . feeding supplement (BOOST / RESOURCE BREEZE) liquid 1 Container  1 Container Oral TID BM Sharma Covert, MD   1 Container at 01/28/18 1728  .  folic acid (FOLVITE) tablet 1 mg  1 mg Oral Daily Sharma Covert, MD   1 mg at 01/29/18 0758  . hydrOXYzine (ATARAX/VISTARIL) tablet 25 mg  25 mg Oral Q6H PRN Sharma Covert, MD   25 mg at 01/28/18 2301  . loperamide (IMODIUM) capsule 2-4 mg  2-4 mg Oral PRN Sharma Covert, MD      . magnesium hydroxide (MILK OF MAGNESIA) suspension 30 mL  30 mL Oral Daily PRN Sharma Covert, MD      . multivitamin (PROSIGHT) tablet 1 tablet  1 tablet Oral Daily Sharma Covert, MD   1 tablet at 01/29/18 0759  . ondansetron (ZOFRAN-ODT) disintegrating tablet 8 mg  8 mg Oral Q8H PRN Benjamine Mola, FNP   8 mg at 01/29/18 0802  . Oxcarbazepine (TRILEPTAL) tablet 300 mg  300 mg Oral BID Sharma Covert, MD      . oxyCODONE (Oxy IR/ROXICODONE) immediate release tablet 5 mg  5 mg Oral Q6H PRN Sharma Covert, MD   5 mg at 01/29/18 1315  . pantoprazole (PROTONIX) EC tablet 40 mg  40 mg Oral Daily Sharma Covert, MD   40 mg at 01/29/18 0757  . QUEtiapine (SEROQUEL) tablet 200 mg  200 mg Oral QHS Sharma Covert, MD   200 mg at 01/28/18 2301  . SUMAtriptan (IMITREX) tablet 50 mg  50 mg Oral Q2H PRN Sharma Covert, MD      . tiZANidine (ZANAFLEX) tablet 8 mg  8 mg Oral TID Sharma Covert, MD   8 mg at 01/29/18 1312  . traZODone (DESYREL) tablet 50 mg  50 mg Oral QHS PRN Benjamine Mola, FNP   50 mg at 01/28/18 2301  . trimethoprim (TRIMPEX) tablet 100 mg  100 mg Oral Daily Sharma Covert, MD   100 mg at 01/29/18 5784    Lab Results: No results found for this or any previous visit (from the past 48 hour(s)).  Blood Alcohol level:  Lab Results  Component Value  Date   ETH <10 69/62/9528    Metabolic Disorder Labs: No results found for: HGBA1C, MPG No results found for: PROLACTIN No results found for: CHOL, TRIG, HDL, CHOLHDL, VLDL, LDLCALC  Physical Findings: AIMS: Facial and Oral Movements Muscles of Facial Expression: None, normal Lips and Perioral Area: None, normal Jaw: None, normal Tongue: None, normal,Extremity Movements Upper (arms, wrists, hands, fingers): None, normal Lower (legs, knees, ankles, toes): None, normal, Trunk Movements Neck, shoulders, hips: None, normal, Overall Severity Severity of abnormal movements (highest score from questions above): None, normal Incapacitation due to abnormal movements: None, normal Patient's awareness of abnormal movements (rate only patient's report): No Awareness, Dental Status Current problems with teeth and/or dentures?: No Does patient usually wear dentures?: No  CIWA:  CIWA-Ar Total: 2 COWS:  COWS Total Score: 2  Musculoskeletal: Strength & Muscle Tone: within normal limits Gait & Station: normal Patient leans: N/A  Psychiatric Specialty Exam: Physical Exam  Constitutional: She is oriented to person, place, and time. She appears well-developed and well-nourished.  HENT:  Head: Normocephalic and atraumatic.  Respiratory: Effort normal.  Neurological: She is alert and oriented to person, place, and time.    ROS  Blood pressure 101/74, pulse 66, temperature 98.2 F (36.8 C), temperature source Oral, resp. rate 20, height 5\' 6"  (1.676 m), weight 56.7 kg (125 lb).Body mass index is 20.18 kg/m.  General Appearance: Casual  Eye Contact:  Fair  Speech:  Normal Rate  Volume:  Normal  Mood:  Anxious, Dysphoric and Irritable  Affect:  Congruent  Thought Process:  Coherent  Orientation:  Full (Time, Place, and Person)  Thought Content:  Logical  Suicidal Thoughts:  No  Homicidal Thoughts:  No  Memory:  Immediate;   Fair Recent;   Fair Remote;   Fair  Judgement:  Intact   Insight:  Fair  Psychomotor Activity:  Increased and Restlessness  Concentration:  Concentration: Fair and Attention Span: Fair  Recall:  [employer] of Knowledge:  Fair  Language:  Fair  Akathisia:  Negative  Handed:  Right  AIMS (if indicated):     Assets:  Communication Skills Desire for Improvement Housing Resilience Social Support  ADL's:  Intact  Cognition:  WNL  Sleep:  Number of Hours: 6.25     Treatment Plan Summary: Daily contact with patient to assess and evaluate symptoms and progress in treatment, Medication management and Plan Patient is seen and examined.  Patient is a 37 year old female with the above-stated past psychiatric history who is seen in follow-up.  She is decided that she would like to go back on the oral pain medicines.  I will give her oxycodone 5 mg p.o. every 6 hours as needed pain.  Her mood seems to be more stable, and we will continue the Cymbalta, BuSpar.  I am going to stop the Catapres given that she is back on the narcotic pain medications.  We will continue the dicyclomine for nausea and GI symptoms.  I am going to increase her Trileptal to 300 mg twice daily for anxiety, sleep and mood stability.  Hopefully that will be beneficial for her.  If things continue to go well over the next 24 hours we will anticipate discharge in 1 to 2 days.  Sharma Covert, MD 01/29/2018, 1:35 PM

## 2018-01-29 NOTE — Progress Notes (Signed)
Adult Psychoeducational Group Note  Date:  01/29/2018 Time:  9:41 PM  Group Topic/Focus:  Wrap-Up Group:   The focus of this group is to help patients review their daily goal of treatment and discuss progress on daily workbooks.  Participation Level:  Active  Participation Quality:  Appropriate  Affect:  Appropriate  Cognitive:  Alert and Appropriate  Insight: Appropriate  Engagement in Group:  Engaged  Modes of Intervention:  Discussion  Additional Comments:  Pt rated the day at 7/10 due to sleeping better and emotions are better as well.  Pt stated pain management is still a major concern at this time.  Leon Montoya 01/29/2018, 9:41 PM

## 2018-01-30 MED ORDER — TRAZODONE HCL 50 MG PO TABS: 50.0000 mg | ORAL_TABLET | Freq: Every evening | ORAL | 0 refills | Status: DC | PRN

## 2018-01-30 MED ORDER — OXCARBAZEPINE 300 MG PO TABS: 300.0000 mg | ORAL_TABLET | Freq: Two times a day (BID) | ORAL | 0 refills | Status: AC

## 2018-01-30 MED ORDER — BUSPIRONE HCL 15 MG PO TABS: 15.0000 mg | ORAL_TABLET | Freq: Three times a day (TID) | ORAL | 0 refills | Status: DC

## 2018-01-30 MED ORDER — QUETIAPINE FUMARATE 200 MG PO TABS: 200.0000 mg | ORAL_TABLET | Freq: Every day | ORAL | 0 refills | Status: DC

## 2018-01-30 MED ORDER — PANTOPRAZOLE SODIUM 40 MG PO TBEC: 40.0000 mg | DELAYED_RELEASE_TABLET | Freq: Every day | ORAL | 0 refills | Status: DC

## 2018-01-30 MED ORDER — DULOXETINE HCL 60 MG PO CPEP: 60.0000 mg | ORAL_CAPSULE | Freq: Two times a day (BID) | ORAL | 0 refills | Status: AC

## 2018-01-30 MED ORDER — SUMATRIPTAN SUCCINATE 50 MG PO TABS: 50.0000 mg | ORAL_TABLET | ORAL | 0 refills | Status: AC | PRN

## 2018-01-30 NOTE — Plan of Care (Signed)
  Problem: Education: Goal: Emotional status will improve Outcome: Completed/Met Goal: Mental status will improve Outcome: Completed/Met   Problem: Activity: Goal: Interest or engagement in activities will improve Outcome: Completed/Met Goal: Sleeping patterns will improve Outcome: Completed/Met   Problem: Coping: Goal: Ability to verbalize frustrations and anger appropriately will improve Outcome: Completed/Met Goal: Ability to demonstrate self-control will improve Outcome: Completed/Met   Problem: Health Behavior/Discharge Planning: Goal: Identification of resources available to assist in meeting health care needs will improve Outcome: Completed/Met Goal: Compliance with treatment plan for underlying cause of condition will improve Outcome: Completed/Met   Problem: Physical Regulation: Goal: Ability to maintain clinical measurements within normal limits will improve Outcome: Completed/Met   Problem: Education: Goal: Utilization of techniques to improve thought processes will improve Outcome: Completed/Met Goal: Knowledge of the prescribed therapeutic regimen will improve Outcome: Completed/Met   Problem: Activity: Goal: Interest or engagement in leisure activities will improve Outcome: Completed/Met Goal: Imbalance in normal sleep/wake cycle will improve Outcome: Completed/Met   Problem: Coping: Goal: Coping ability will improve Outcome: Completed/Met Goal: Will verbalize feelings Outcome: Completed/Met   Problem: Health Behavior/Discharge Planning: Goal: Ability to make decisions will improve Outcome: Completed/Met Goal: Compliance with therapeutic regimen will improve Outcome: Completed/Met   Problem: Role Relationship: Goal: Will demonstrate positive changes in social behaviors and relationships Outcome: Completed/Met   Problem: Safety: Goal: Ability to disclose and discuss suicidal ideas will improve Outcome: Completed/Met Goal: Ability to identify and  utilize support systems that promote safety will improve Outcome: Completed/Met   Problem: Self-Concept: Goal: Will verbalize positive feelings about self Outcome: Completed/Met Goal: Level of anxiety will decrease Outcome: Completed/Met   Problem: Education: Goal: Ability to state activities that reduce stress will improve Outcome: Completed/Met   Problem: Coping: Goal: Ability to identify and develop effective coping behavior will improve Outcome: Completed/Met   Problem: Self-Concept: Goal: Ability to identify factors that promote anxiety will improve Outcome: Completed/Met Goal: Level of anxiety will decrease Outcome: Completed/Met Goal: Ability to modify response to factors that promote anxiety will improve Outcome: Completed/Met   Problem: Education: Goal: Ability to make informed decisions regarding treatment will improve Outcome: Completed/Met   Problem: Coping: Goal: Coping ability will improve Outcome: Completed/Met   Problem: Health Behavior/Discharge Planning: Goal: Identification of resources available to assist in meeting health care needs will improve Outcome: Completed/Met   Problem: Self-Concept: Goal: Ability to disclose and discuss suicidal ideas will improve Outcome: Completed/Met Goal: Will verbalize positive feelings about self Outcome: Completed/Met

## 2018-01-30 NOTE — Progress Notes (Signed)
  [facility] Adult Case Management Discharge Plan :  Will you be returning to the same living situation after discharge:  Yes,  home At discharge, do you have transportation home?: Yes,  friend Do you have the ability to pay for your medications: Yes,  MCD  Release of information consent forms completed and in the chart;  Patient's signature needed at discharge.  Patient to Follow up at: Follow-up Information    [facility] Go on 02/09/2018.   Specialty:  Behavioral Health Why:  Appointment for medication management is Thursday, 02/09/18 at 8:15am. Please be sure to bring your Photo ID, SSN, insurance information and any discharge paperwork from this hospitalization.  Contact information: [place] [address] [phone]        [facility] Follow up on 02/06/2018.   Specialty:  Behavioral Health Why:  Therapy appt on Monday, 7/15 at 1:30PM for intake and assessment for therapy at 2:00PM. Please bring: photo ID and Medicaid card. If you have to cancel or reschedule for any reason, please call within 48 hrs of this appt to do so. Thank you.  Contact information: [address] [place], [place] [address] [phone]           Next level of care provider has access to [place] and Suicide Prevention discussed: Yes,  yes  Have you used any form of tobacco in the last 30 days? (Cigarettes, Smokeless Tobacco, Cigars, and/or Pipes): No  Has patient been referred to the Quitline?: N/A patient is not a smoker  Patient has been referred for addiction treatment: [place], LCSW 01/30/2018, 11:22 AM

## 2018-01-30 NOTE — Discharge Summary (Signed)
Physician Discharge Summary Note  Patient:  Alexis Singleton is an 37 y.o., female MRN:  [id number] DOB:  March 11, 1981 Patient phone:  [phone] (home)  Patient address:   [address] [place] [address],  Total Time spent with patient: 30 minutes  Date of Admission:  01/24/2018 Date of Discharge: 01/30/2018  Reason for Admission:  Patient is seen and examined.  Patient is a 37 year old female with multiple past medical problems as well as worsening depression and anxiety over the last several weeks.  She has been treated for chronic pain with opiates for several years.  The patient stated that she has been recently caretaking to 1 of her family members who had recently passed.  She is also been caretaking another family member who had recently died.  She found out that her father was apparently dying.  She originally presented to the emergency department and stated that she had run out of several of her medications, and wanted to get back on her depression medications.  When she got to the hospital she admitted that she had been taking her Cymbalta 60 mg p.o. twice daily but had not been taking several other medications including BuSpar.  She was significantly physically agitated and fidgeting.  During the course of the interview it became clear that there was some withdrawal symptoms.  She stated that she had been on fentanyl as well as oxycodone for several years.  [place] her patches "disappeared".  She stated she had not had her opiates for 6 days.  Of note, her drug screen today was negative for opiates.  She filled her prescription for fentanyl patches on 6/29.  She had filled her oxycodone prescription shortly prior to this.  She stated 1 of her concerns and coming in today was that she wanted to get off the opiates because 1 of her family members had died of complications of liver disease.  She was afraid of what these medicines were  doing to her liver.  She had been discharged from her psychiatrist back in 2018.  She also stated she had not been on her thyroid medicine for several months.  Again of note, her TSH in the emergency room was normal.  She is at times tangential and agitated.  We discussed treatment for opiate withdrawal, and reaffirmed with her her desire to come off the opiates.  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  She was admitted to the hospital for evaluation and stabilization. Associated Signs/Symptoms: Depression Symptoms:  depressed mood, anhedonia, insomnia, psychomotor agitation, fatigue, feelings of worthlessness/guilt, difficulty concentrating, hopelessness, anxiety, panic attacks, loss of energy/fatigue, disturbed sleep, weight loss, (Hypo) Manic Symptoms:  Impulsivity, Irritable Mood, Anxiety Symptoms:  Excessive Worry, Psychotic Symptoms:  Denied PTSD Symptoms: Negative   Past Psychiatric History: Patient had been followed by psychiatry in the [facility] health system.  This office was in [facility].  She was apparently discharged from that clinic after missing four appointments.  She denied any previous psychiatric hospitalizations.  Consequences of Substance Abuse: She stated she had started using marijuana recently for her chronic pain issues, and insomnia. Previous Psychotropic Medications: Yes   Principal Problem: Bipolar 2 disorder, major depressive episode [facility]) Discharge Diagnoses: Patient Active Problem List   Diagnosis Date Noted  . Bipolar 2 disorder, major depressive episode ([place]) [F31.81] 01/26/2018  .  Major depressive disorder, recurrent severe without psychotic features ([place]) [F33.2] 01/24/2018  . GAD (generalized anxiety disorder) [F41.1] 01/24/2018  . Opiate withdrawal ([place]) [F11.23]   . Opioid  dependence with withdrawal ([place]) [F11.23]   . Chronic back pain [M54.9, G89.29] 02/14/2014  . Chronic neck pain [M54.2, G89.29] 02/14/2014  . Fibromyalgia affecting multiple sites [M79.7] 02/14/2014  . Other chronic pain [G89.29] 02/14/2014    Past Medical History:  Past Medical History:  Diagnosis Date  . 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 Arthritis   . Breast cancer ([place])   . Chronic pain   . Fibromyalgia   . IC (interstitial cystitis)   . Ovarian cancer ([place])   . Pulmonary embolism ([place])   . Seizures ([place])   . Spinal compression fracture ([place])   . Thyroid cancer ([place])   . Thyroid cancer ([place])   . Uterine cancer [facility])     Past Surgical History:  Procedure Laterality Date  . ABDOMINAL EXPLORATION SURGERY    . ABDOMINAL HYSTERECTOMY    . APPENDECTOMY    . BREAST LUMPECTOMY    . CHOLECYSTECTOMY    . COMBINED AUGMENTATION MAMMAPLASTY AND ABDOMINOPLASTY    . GASTRIC BYPASS    . I&D EXTREMITY Left 01/18/2015   Procedure: IRRIGATION AND DEBRIDEMENT, PINNING  AND  REPAIR NAIL BED LEFT MIDDLE FINGER;  Surgeon: Leanora Cover, MD;  Location: [place];  Service: Orthopedics;  Laterality: Left;  . SINUSOTOMY    . THYROIDECTOMY     Family History:  Family History  Problem Relation Age of Onset  . Alcohol abuse Father    Family Psychiatric  History: She stated her mother had depression.  Social History:  Social History   Substance and Sexual Activity  Alcohol Use Yes   Comment: occassionally     Social History   Substance and Sexual Activity  Drug Use Yes  . Types: Marijuana    Social History   Socioeconomic History  . Marital status: Legally Separated    Spouse name: Not on file  . Number of children: Not on file  . Years of education: Not on file  . Highest education level: Not on file  Occupational History  . Not on file  Social Needs  . Financial resource strain: Not on file  . Food insecurity:    Worry: Not on file    Inability: Not on file  .  Transportation needs:    Medical: Not on file    Non-medical: Not on file  Tobacco Use  . Smoking status: Never Smoker  . Smokeless tobacco: Never Used  Substance and Sexual Activity  . Alcohol use: Yes    Comment: occassionally  . Drug use: Yes    Types: Marijuana  . Sexual activity: Not Currently  Lifestyle  . Physical activity:    Days per week: Not on file    Minutes per session: Not on file  . Stress: Not on file  Relationships  . Social connections:    Talks on phone: Not on file    Gets together: Not on file    Attends religious service: Not on file    Active member of club or organization: Not on file    Attends meetings of clubs or organizations: Not on file    Relationship status: Not on file  Other Topics Concern  . Not on file  Social History Narrative  . Not on file    Hospital Course:  Alexis Singleton was admitted for Bipolar 2  disorder, major depressive episode ([place]) and crisis management.  She was treated with the following medications quetiapine 200 mg p.o. nightly, Trileptal 300 mg p.o. twice daily, Cymbalta 60 mg p.o. twice daily, and trazodone 50 mg p.o. nightly as needed for insomnia.   Alexis Singleton was discharged with current medication and was instructed on how to take medications as prescribed; (details listed below under Medication List).  Medical problems were identified and treated as needed.  Patient with multiple medical comorbidities and was resumed on medication as appropriate.  Home medications were restarted as appropriate.  Labs obtained during admission have been reviewed and assess and determined to be within normal with the exception of a positive UDS H for THC, hemoglobin and hematocrit decreased.  Improvement was monitored by observation and Alexis Singleton daily report of symptom reduction.  Emotional and mental status was monitored by daily self-inventory reports completed by Alexis Singleton and clinical staff.         Alexis Singleton was  evaluated by the treatment team for stability and plans for continued recovery upon discharge.  Alexis Singleton motivation was an integral factor for scheduling further treatment.  Employment, transportation, bed availability, health status, family support, and any pending legal issues were also considered during her hospital stay.  She was offered further treatment options upon discharge including but not limited to Residential, Intensive Outpatient, and Outpatient treatment.  Alexis Singleton will follow up with the services as listed below under Follow Up Information.     Upon completion of this admission the Alexis Singleton was both mentally and medically stable for discharge denying suicidal/homicidal ideation, auditory/visual/tactile hallucinations, delusional thoughts and paranoia.      Physical Findings: AIMS: Facial and Oral Movements Muscles of Facial Expression: None, normal Lips and Perioral Area: None, normal Jaw: None, normal Tongue: None, normal,Extremity Movements Upper (arms, wrists, hands, fingers): None, normal Lower (legs, knees, ankles, toes): None, normal, Trunk Movements Neck, shoulders, hips: None, normal, Overall Severity Severity of abnormal movements (highest score from questions above): None, normal Incapacitation due to abnormal movements: None, normal Patient's awareness of abnormal movements (rate only patient's report): No Awareness, Dental Status Current problems with teeth and/or dentures?: No Does patient usually wear dentures?: No  CIWA:  CIWA-Ar Total: 2 COWS:  COWS Total Score: 3  Musculoskeletal: Strength & Muscle Tone: within normal limits Gait & Station: normal Patient leans: N/A  Psychiatric Specialty Exam: See MD SRA Physical Exam  ROS  Blood pressure 95/63, pulse (!) 124, temperature 98.4 F (36.9 C), temperature source Oral, resp. rate 20, height 5' 6"  (1.676 m), weight 56.7 kg (125 lb).Body mass index is 20.18 kg/m.  Sleep:  Number of Hours:  6     Have you used any form of tobacco in the last 30 days? (Cigarettes, Smokeless Tobacco, Cigars, and/or Pipes): No  Has this patient used any form of tobacco in the last 30 days? (Cigarettes, Smokeless Tobacco, Cigars, and/or Pipes)  No  Blood Alcohol level:  Lab Results  Component Value Date   ETH <10 77/82/4235    Metabolic Disorder Labs:  No results found for: HGBA1C, MPG No results found for: PROLACTIN No results found for: CHOL, TRIG, HDL, CHOLHDL, VLDL, LDLCALC  See Psychiatric Specialty Exam and Suicide Risk Assessment completed by Attending Physician prior to discharge.  Discharge destination:  Home  Is patient on multiple antipsychotic therapies at discharge:  No   Has Patient had three or more failed trials of antipsychotic monotherapy by history:  No  Recommended Plan for Multiple Antipsychotic Therapies: NA  Discharge Instructions    Discharge instructions   Complete by:  As directed      Allergies as of 01/30/2018      Reactions   Ioxaglate Anaphylaxis   Ivp Dye [iodinated Diagnostic Agents] Anaphylaxis   Metrizamide Anaphylaxis   Other Anaphylaxis   Seafood   Tramadol Other (See Comments)   Seizures   Carbamazepine Other (See Comments)   Headache   Buprenorphine Nausea Only   Divalproex Sodium Other (See Comments)   Increased seizure activity   Levetiracetam Other (See Comments)   Increased seizure activity   Nsaids Other (See Comments)   Gastric bypass patient   Morphine Itching   Topiramate Nausea Only   Valproic Acid Nausea Only      Medication List    STOP taking these medications   lidocaine 1 % (with preservative) injection Commonly known as:  XYLOCAINE   ondansetron 8 MG disintegrating tablet Commonly known as:  ZOFRAN-ODT   sodium bicarbonate 8.4% (1 mEq/mL) Soln infusion   SUMAtriptan 6 MG/0.5ML Soln injection Commonly known as:  IMITREX Replaced by:  SUMAtriptan 50 MG tablet     TAKE these medications     Indication   busPIRone 15 MG tablet Commonly known as:  BUSPAR Take 1 tablet (15 mg total) by mouth 3 (three) times daily. What changed:  when to take this  Indication:  Anxiety Disorder   Cyanocobalamin 1000 MCG/ML Kit Inject 1,000 mg as directed every 30 (thirty) days.  Indication:  Inadequate Vitamin B12   DULoxetine 60 MG capsule Commonly known as:  CYMBALTA Take 1 capsule (60 mg total) by mouth 2 (two) times daily.  Indication:  Major Depressive Disorder   ferrous sulfate 325 (65 FE) MG tablet Take 1 tablet (325 mg total) by mouth 2 (two) times daily with a meal.  Indication:  Anemia From Inadequate Iron in the Body   multivitamin capsule Take 1 capsule by mouth daily.  Indication:  Nutritional deficieny   Oxcarbazepine 300 MG tablet Commonly known as:  TRILEPTAL Take 1 tablet (300 mg total) by mouth 2 (two) times daily.  Indication:  mood stabilization   pantoprazole 40 MG tablet Commonly known as:  PROTONIX Take 1 tablet (40 mg total) by mouth daily. Start taking on:  01/31/2018 What changed:    medication strength  how much to take  Indication:  Gastroesophageal Reflux Disease   pentosan polysulfate 100 MG capsule Commonly known as:  ELMIRON Place 100 mg into the urethra as needed (for bladder).  Indication:  Chronic Bladder Wall Inflammation   QUEtiapine 200 MG tablet Commonly known as:  SEROQUEL Take 1 tablet (200 mg total) by mouth at bedtime.  Indication:  Depressive Phase of Manic-Depression   solifenacin 10 MG tablet Commonly known as:  VESICARE Take 10 mg by mouth daily.  Indication:  Overactive Bladder, Frequent Urination   SUMAtriptan 50 MG tablet Commonly known as:  IMITREX Take 1 tablet (50 mg total) by mouth every 2 (two) hours as needed for migraine or headache. May repeat in 2 hours if headache persists or recurs. Replaces:  SUMAtriptan 6 MG/0.5ML Soln injection  Indication:  Headache   thyroid 60 MG tablet Commonly known as:  ARMOUR Take 60 mg  by mouth daily before breakfast.  Indication:  Underactive Thyroid   tiZANidine 4 MG capsule Commonly known as:  ZANAFLEX Take 4 mg by mouth 4 (four) times daily.  Indication:  Lower Backache   traZODone  50 MG tablet Commonly known as:  DESYREL Take 1 tablet (50 mg total) by mouth at bedtime as needed for sleep. What changed:    how much to take  when to take this  reasons to take this  Indication:  Trouble Sleeping   URIBEL 118 MG Caps Take 118 mg by mouth as needed (for bladder).  Indication:  bladder spasms      Follow-up Information    [facility] Go on 02/09/2018.   Specialty:  Behavioral Health Why:  Appointment for medication management is Thursday, 02/09/18 at 8:15am. Please be sure to bring your Photo ID, SSN, insurance information and any discharge paperwork from this hospitalization.  Contact information: [place] [address] [phone]        [facility] Follow up on 02/06/2018.   Specialty:  Behavioral Health Why:  Therapy appt on Monday, 7/15 at 1:30PM for intake and assessment for therapy at 2:00PM. Please bring: photo ID and Medicaid card. If you have to cancel or reschedule for any reason, please call within 48 hrs of this appt to do so. Thank you.  Contact information: [address], [place] [employer] [address] [phone]           Follow-up recommendations:Activity:  Increase activity as tolerated. Diet:  Routine house diet as suggested by outpatient physician. Tests:  Routine test as determined by outpatient psychiatrist. Other:  Even if you began to feel better please continue to take your medication daily.     Signed: Nanci Pina, FNP 01/30/2018, 9:00 AM

## 2018-01-30 NOTE — Progress Notes (Signed)
Discharge note:  Patient discharged home per MD order.  She received all personal belongings from locker and unit.  Patient left with prescriptions of her medications.  Reviewed AVS/transition record with patient and she indicated understanding.  She denies any thoughts of self harm.  Patient left ambulatory with her fiance.

## 2018-01-30 NOTE — Tx Team (Signed)
Interdisciplinary Treatment and Diagnostic Plan Update  01/30/2018 Time of Session: 0830AM Alexis Singleton MRN: [id number]  Principal Diagnosis: MDD  Secondary Diagnoses: Principal Problem:   Bipolar 2 disorder, major depressive episode ([place]) Active Problems:   GAD (generalized anxiety disorder)   Opiate withdrawal ([place])   Opioid dependence with withdrawal ([place])   Current Medications:  Current Facility-Administered Medications  Medication Dose Route Frequency Provider Last Rate Last Dose  . acetaminophen (TYLENOL) tablet 650 mg  650 mg Oral Q6H PRN Sharma Covert, MD   650 mg at 01/27/18 1120  . alum & mag hydroxide-simeth (MAALOX/MYLANTA) 200-200-20 MG/5ML suspension 30 mL  30 mL Oral Q4H PRN Sharma Covert, MD      . busPIRone (BUSPAR) tablet 15 mg  15 mg Oral TID Sharma Covert, MD   15 mg at 01/30/18 0825  . cholecalciferol (VITAMIN D) tablet 400 Units  400 Units Oral Daily Sharma Covert, MD   400 Units at 01/30/18 0825  . darifenacin (ENABLEX) 24 hr tablet 15 mg  15 mg Oral Daily Sharma Covert, MD   15 mg at 01/30/18 0825  . dicyclomine (BENTYL) tablet 20 mg  20 mg Oral TID AC Sharma Covert, MD   20 mg at 01/30/18 0636  . DULoxetine (CYMBALTA) DR capsule 60 mg  60 mg Oral BID Sharma Covert, MD   60 mg at 01/30/18 0825  . feeding supplement (BOOST / RESOURCE BREEZE) liquid 1 Container  1 Container Oral TID BM Sharma Covert, MD   1 Container at 01/28/18 1728  . folic acid (FOLVITE) tablet 1 mg  1 mg Oral Daily Sharma Covert, MD   1 mg at 01/30/18 0825  . magnesium hydroxide (MILK OF MAGNESIA) suspension 30 mL  30 mL Oral Daily PRN Sharma Covert, MD      . multivitamin (PROSIGHT) tablet 1 tablet  1 tablet Oral Daily Sharma Covert, MD   1 tablet at 01/30/18 0825  . ondansetron (ZOFRAN-ODT) disintegrating tablet 8 mg  8 mg Oral Q8H PRN Benjamine Mola, FNP   8 mg at 01/30/18 0636  . Oxcarbazepine (TRILEPTAL) tablet 300 mg  300 mg Oral  BID Sharma Covert, MD   300 mg at 01/30/18 0825  . oxyCODONE (Oxy IR/ROXICODONE) immediate release tablet 5 mg  5 mg Oral Q6H PRN Sharma Covert, MD   5 mg at 01/30/18 0636  . pantoprazole (PROTONIX) EC tablet 40 mg  40 mg Oral Daily Sharma Covert, MD   40 mg at 01/30/18 0825  . QUEtiapine (SEROQUEL) tablet 200 mg  200 mg Oral QHS Sharma Covert, MD   200 mg at 01/29/18 2228  . SUMAtriptan (IMITREX) tablet 50 mg  50 mg Oral Q2H PRN Sharma Covert, MD      . tiZANidine (ZANAFLEX) tablet 8 mg  8 mg Oral TID Sharma Covert, MD   8 mg at 01/30/18 0825  . traZODone (DESYREL) tablet 50 mg  50 mg Oral QHS PRN Benjamine Mola, FNP   50 mg at 01/29/18 2228  . trimethoprim (TRIMPEX) tablet 100 mg  100 mg Oral Daily Sharma Covert, MD   100 mg at 01/30/18 0825   Current Outpatient Medications  Medication Sig Dispense Refill  . busPIRone (BUSPAR) 15 MG tablet Take 1 tablet (15 mg total) by mouth 3 (three) times daily. 90 tablet 0  . Cyanocobalamin 1000 MCG/ML KIT Inject 1,000 mg as directed every 30 (  thirty) days.    . DULoxetine (CYMBALTA) 60 MG capsule Take 1 capsule (60 mg total) by mouth 2 (two) times daily. 60 capsule 0  . ferrous sulfate 325 (65 FE) MG tablet Take 1 tablet (325 mg total) by mouth 2 (two) times daily with a meal. 60 tablet 1  . Meth-Hyo-M Bl-Na Phos-Ph Sal (URIBEL) 118 MG CAPS Take 118 mg by mouth as needed (for bladder).     . Multiple Vitamin (MULTIVITAMIN) capsule Take 1 capsule by mouth daily.     . Oxcarbazepine (TRILEPTAL) 300 MG tablet Take 1 tablet (300 mg total) by mouth 2 (two) times daily. 60 tablet 0  . [START ON 01/31/2018] pantoprazole (PROTONIX) 40 MG tablet Take 1 tablet (40 mg total) by mouth daily. 30 tablet 0  . pentosan polysulfate (ELMIRON) 100 MG capsule Place 100 mg into the urethra as needed (for bladder).     . QUEtiapine (SEROQUEL) 200 MG tablet Take 1 tablet (200 mg total) by mouth at bedtime. 30 tablet 0  . solifenacin (VESICARE)  10 MG tablet Take 10 mg by mouth daily.     . SUMAtriptan (IMITREX) 50 MG tablet Take 1 tablet (50 mg total) by mouth every 2 (two) hours as needed for migraine or headache. May repeat in 2 hours if headache persists or recurs. 10 tablet 0  . thyroid (ARMOUR) 60 MG tablet Take 60 mg by mouth daily before breakfast.    . tiZANidine (ZANAFLEX) 4 MG capsule Take 4 mg by mouth 4 (four) times daily.     . traZODone (DESYREL) 50 MG tablet Take 1 tablet (50 mg total) by mouth at bedtime as needed for sleep. 30 tablet 0   PTA Medications: No medications prior to admission.    Patient Stressors: Health problems Medication change or noncompliance  Patient Strengths: Ability for insight Average or above average intelligence Capable of independent living General fund of knowledge  Treatment Modalities: Medication Management, Group therapy, Case management,  1 to 1 session with clinician, Psychoeducation, Recreational therapy.   Physician Treatment Plan for Primary Diagnosis: MDD   Long Term Goal(s): Improvement in symptoms so as ready for discharge Improvement in symptoms so as ready for discharge   Short Term Goals: Ability to identify changes in lifestyle to reduce recurrence of condition will improve Ability to verbalize feelings will improve Ability to disclose and discuss suicidal ideas Ability to demonstrate self-control will improve Ability to identify and develop effective coping behaviors will improve Ability to maintain clinical measurements within normal limits will improve Compliance with prescribed medications will improve Ability to identify triggers associated with substance abuse/mental health issues will improve Ability to identify changes in lifestyle to reduce recurrence of condition will improve Ability to verbalize feelings will improve Ability to disclose and discuss suicidal ideas Ability to demonstrate self-control will improve Ability to identify and develop  effective coping behaviors will improve Ability to maintain clinical measurements within normal limits will improve Compliance with prescribed medications will improve Ability to identify triggers associated with substance abuse/mental health issues will improve  Medication Management: Evaluate patient's response, side effects, and tolerance of medication regimen.  Therapeutic Interventions: 1 to 1 sessions, Unit Group sessions and Medication administration.  Evaluation of Outcomes: Adequate for discharge   Physician Treatment Plan for Secondary Diagnosis: Principal Problem:   Bipolar 2 disorder, major depressive episode ([place]) Active Problems:   GAD (generalized anxiety disorder)   Opiate withdrawal ([facility])   Opioid dependence with withdrawal ([place])  Long Term Goal(s):  Improvement in symptoms so as ready for discharge Improvement in symptoms so as ready for discharge   Short Term Goals: Ability to identify changes in lifestyle to reduce recurrence of condition will improve Ability to verbalize feelings will improve Ability to disclose and discuss suicidal ideas Ability to demonstrate self-control will improve Ability to identify and develop effective coping behaviors will improve Ability to maintain clinical measurements within normal limits will improve Compliance with prescribed medications will improve Ability to identify triggers associated with substance abuse/mental health issues will improve Ability to identify changes in lifestyle to reduce recurrence of condition will improve Ability to verbalize feelings will improve Ability to disclose and discuss suicidal ideas Ability to demonstrate self-control will improve Ability to identify and develop effective coping behaviors will improve Ability to maintain clinical measurements within normal limits will improve Compliance with prescribed medications will improve Ability to identify triggers associated with substance abuse/mental  health issues will improve     Medication Management: Evaluate patient's response, side effects, and tolerance of medication regimen.  Therapeutic Interventions: 1 to 1 sessions, Unit Group sessions and Medication administration.  Evaluation of Outcomes: Adequate for discharge  RN Treatment Plan for Primary Diagnosis:MDD Long Term Goal(s): Knowledge of disease and therapeutic regimen to maintain health will improve  Short Term Goals: Ability to remain free from injury will improve, Ability to demonstrate self-control and Ability to disclose and discuss suicidal ideas  Medication Management: RN will administer medications as ordered by provider, will assess and evaluate patient's response and provide education to patient for prescribed medication. RN will report any adverse and/or side effects to prescribing provider.  Therapeutic Interventions: 1 on 1 counseling sessions, Psychoeducation, Medication administration, Evaluate responses to treatment, Monitor vital signs and CBGs as ordered, Perform/monitor CIWA, COWS, AIMS and Fall Risk screenings as ordered, Perform wound care treatments as ordered.  Evaluation of Outcomes: Adequate for discharge   LCSW Treatment Plan for Primary Diagnosis: MDD Long Term Goal(s): Safe transition to appropriate next level of care at discharge, Engage patient in therapeutic group addressing interpersonal concerns.  Short Term Goals: Engage patient in aftercare planning with referrals and resources, Facilitate patient progression through stages of change regarding substance use diagnoses and concerns and Identify triggers associated with mental health/substance abuse issues  Therapeutic Interventions: Assess for all discharge needs, 1 to 1 time with Social worker, Explore available resources and support systems, Assess for adequacy in community support network, Educate family and significant other(s) on suicide prevention, Complete Psychosocial Assessment,  Interpersonal group therapy.  Evaluation of Outcomes: Adequate for discharge   Progress in Treatment: Attending groups: Yes. Participating in groups: Yes. Taking medication as prescribed: Yes. Toleration medication: Yes. Family/Significant other contact made: Collateral information obtained from pt's fiance; SPE completed.  Patient understands diagnosis: Yes. Discussing patient identified problems/goals with staff: Yes. Medical problems stabilized or resolved: Yes. Denies suicidal/homicidal ideation: No. Issues/concerns per patient self-inventory: No.  Other: n/a  New problem(s) identified: No, Describe:  n/a  New Short Term/Long Term Goal(s): medication management for mood stabilization; elimination of SI thoughts; development of comprehensive mental wellness/sobriety plan.   Patient Goals:  "to work on my coping skills so I don't isolate and push people away."   Discharge Plan or Barriers: Pt set up with [facility] per her request. She was referred to therapy at [facility] in [place]. [place] pamphlet, [facility] information provided to patient for additional community support and resources.   Reason for Continuation of Hospitalization: none  Estimated Length of  Stay: Monday, 01/30/18  Attendees: Patient: 01/30/2018 12:18 PM  Physician: Dr. Mallie Darting MD 01/30/2018 12:18 PM  Nursing: Sharl Ma RN; [place] RN 01/30/2018 12:18 PM  RN Care Manager:x 01/30/2018 12:18 PM  Social Worker: Janice Norrie LCSW 01/30/2018 12:18 PM  Recreational Therapist: x 01/30/2018 12:18 PM  Other: Saul Fordyce NP; Ricky Ala NP 01/30/2018 12:18 PM  Other:  01/30/2018 12:18 PM  Other: 01/30/2018 12:18 PM    Scribe for Treatment Team: Avelina Laine, LCSW 01/30/2018 12:18 PM

## 2018-01-30 NOTE — [facility] Suicide Risk Assessment (Signed)
[facility] Discharge Suicide Risk Assessment   Principal Problem: Bipolar 2 disorder, major depressive episode [facility]) Discharge Diagnoses:  Patient Active Problem List   Diagnosis Date Noted  . Bipolar 2 disorder, major depressive episode ([place]) [F31.81] 01/26/2018  . Major depressive disorder, recurrent severe without psychotic features ([place]) [F33.2] 01/24/2018  . GAD (generalized anxiety disorder) [F41.1] 01/24/2018  . Opiate withdrawal ([place]) [F11.23]   . Opioid dependence with withdrawal ([place]) [F11.23]   . Chronic back pain [M54.9, G89.29] 02/14/2014  . Chronic neck pain [M54.2, G89.29] 02/14/2014  . Fibromyalgia affecting multiple sites [M79.7] 02/14/2014  . Other chronic pain [G89.29] 02/14/2014    Total Time spent with patient: 30 minutes  Musculoskeletal: Strength & Muscle Tone: within normal limits Gait & Station: normal Patient leans: N/A  Psychiatric Specialty Exam: Review of Systems  Gastrointestinal: Positive for nausea.  Musculoskeletal: Positive for back pain, joint pain and myalgias.  Neurological: Positive for headaches.  All other systems reviewed and are negative.   Blood pressure 95/63, pulse (!) 124, temperature 98.4 F (36.9 C), temperature source Oral, resp. rate 20, height 5\' 6"  (1.676 m), weight 56.7 kg (125 lb).Body mass index is 20.18 kg/m.  General Appearance: Casual  Eye Contact::  Fair  Speech:  Normal Rate409  Volume:  Normal  Mood:  Anxious  Affect:  Appropriate  Thought Process:  Coherent  Orientation:  Full (Time, Place, and Person)  Thought Content:  Logical  Suicidal Thoughts:  No  Homicidal Thoughts:  No  Memory:  Immediate;   Fair Recent;   Fair Remote;   Fair  Judgement:  Intact  Insight:  Fair  Psychomotor Activity:  Normal  Concentration:  Fair  Recall:  [employer] of Knowledge:Fair  Language: Good  Akathisia:  Negative  Handed:  Right  AIMS (if indicated):     Assets:  Communication Skills Desire for  Improvement Housing Resilience Social Support Transportation  Sleep:  Number of Hours: 6  Cognition: WNL  ADL's:  Intact   Mental Status Per Nursing Assessment::   On Admission:  Suicidal ideation indicated by patient, Self-harm thoughts  Demographic Factors:  Divorced or widowed and Caucasian  Loss Factors: NA  Historical Factors: Impulsivity  Risk Reduction Factors:   Responsible for children under 15 years of age, Sense of responsibility to family, Living with another person, especially a relative and Positive social support  Continued Clinical Symptoms:  Bipolar Disorder:   Bipolar II  Cognitive Features That Contribute To Risk:  None    Suicide Risk:  Minimal: No identifiable suicidal ideation.  Patients presenting with no risk factors but with morbid ruminations; may be classified as minimal risk based on the severity of the depressive symptoms  Follow-up Information    [facility]. Go on 02/09/2018.   Specialty:  Behavioral Health Why:  Appointment for medication management is Thursday, 02/09/18 at 8:15am. Please be sure to bring your Photo ID, SSN, insurance information and any discharge paperwork from this hospitalization.  Contact information: [place] [address] [phone]        [facility] Follow up on 02/06/2018.   Specialty:  Behavioral Health Why:  Therapy appt on Monday, 7/15 at 1:30PM for intake and assessment for therapy at 2:00PM. Please bring: photo ID and Medicaid card. If you have to cancel or reschedule for any reason, please call within 48 hrs of this appt to do so. Thank you.  Contact information: [place], [place]  [place] [address] [phone]           Plan Of Care/Follow-up recommendations:  Activity:  ad lib  Sharma Covert, MD 01/30/2018, 7:38 AM

## 2019-04-15 ENCOUNTER — Inpatient Hospital Stay (HOSPITAL_BASED_OUTPATIENT_CLINIC_OR_DEPARTMENT_OTHER)
Admission: EM | Admit: 2019-04-15 | Discharge: 2019-04-19 | DRG: 378 | Disposition: A | Discharge status: Home / Self Care | Payer: Medicaid Other | Attending: Cardiovascular Disease | Admitting: Cardiovascular Disease

## 2019-04-15 ENCOUNTER — Other Ambulatory Visit: Payer: Self-pay

## 2019-04-15 ENCOUNTER — Encounter (HOSPITAL_BASED_OUTPATIENT_CLINIC_OR_DEPARTMENT_OTHER): Payer: Self-pay

## 2019-04-15 DIAGNOSIS — Z811 Family history of alcohol abuse and dependence: Secondary | ICD-10-CM

## 2019-04-15 DIAGNOSIS — K5900 Constipation, unspecified: Secondary | ICD-10-CM | POA: Diagnosis present

## 2019-04-15 DIAGNOSIS — K922 Gastrointestinal hemorrhage, unspecified: Secondary | ICD-10-CM

## 2019-04-15 DIAGNOSIS — G40909 Epilepsy, unspecified, not intractable, without status epilepticus: Secondary | ICD-10-CM | POA: Diagnosis present

## 2019-04-15 DIAGNOSIS — D62 Acute posthemorrhagic anemia: Secondary | ICD-10-CM | POA: Diagnosis present

## 2019-04-15 DIAGNOSIS — K219 Gastro-esophageal reflux disease without esophagitis: Secondary | ICD-10-CM | POA: Diagnosis present

## 2019-04-15 DIAGNOSIS — F411 Generalized anxiety disorder: Secondary | ICD-10-CM | POA: Diagnosis present

## 2019-04-15 DIAGNOSIS — D619 Aplastic anemia, unspecified: Secondary | ICD-10-CM | POA: Diagnosis present

## 2019-04-15 DIAGNOSIS — M797 Fibromyalgia: Secondary | ICD-10-CM | POA: Diagnosis present

## 2019-04-15 DIAGNOSIS — Z90710 Acquired absence of both cervix and uterus: Secondary | ICD-10-CM | POA: Diagnosis not present

## 2019-04-15 DIAGNOSIS — Z8543 Personal history of malignant neoplasm of ovary: Secondary | ICD-10-CM

## 2019-04-15 DIAGNOSIS — Z20828 Contact with and (suspected) exposure to other viral communicable diseases: Secondary | ICD-10-CM | POA: Diagnosis present

## 2019-04-15 DIAGNOSIS — Z86711 Personal history of pulmonary embolism: Secondary | ICD-10-CM

## 2019-04-15 DIAGNOSIS — Z85850 Personal history of malignant neoplasm of thyroid: Secondary | ICD-10-CM

## 2019-04-15 DIAGNOSIS — Z9884 Bariatric surgery status: Secondary | ICD-10-CM | POA: Diagnosis not present

## 2019-04-15 DIAGNOSIS — K269 Duodenal ulcer, unspecified as acute or chronic, without hemorrhage or perforation: Secondary | ICD-10-CM

## 2019-04-15 DIAGNOSIS — K2971 Gastritis, unspecified, with bleeding: Principal | ICD-10-CM | POA: Diagnosis present

## 2019-04-15 DIAGNOSIS — Z79899 Other long term (current) drug therapy: Secondary | ICD-10-CM | POA: Diagnosis not present

## 2019-04-15 DIAGNOSIS — Z800 Family history of malignant neoplasm of digestive organs: Secondary | ICD-10-CM

## 2019-04-15 DIAGNOSIS — Z8542 Personal history of malignant neoplasm of other parts of uterus: Secondary | ICD-10-CM | POA: Diagnosis not present

## 2019-04-15 DIAGNOSIS — F329 Major depressive disorder, single episode, unspecified: Secondary | ICD-10-CM | POA: Diagnosis present

## 2019-04-15 DIAGNOSIS — E890 Postprocedural hypothyroidism: Secondary | ICD-10-CM | POA: Diagnosis present

## 2019-04-15 DIAGNOSIS — K254 Chronic or unspecified gastric ulcer with hemorrhage: Secondary | ICD-10-CM | POA: Diagnosis present

## 2019-04-15 DIAGNOSIS — G894 Chronic pain syndrome: Secondary | ICD-10-CM | POA: Diagnosis present

## 2019-04-15 DIAGNOSIS — Z853 Personal history of malignant neoplasm of breast: Secondary | ICD-10-CM | POA: Diagnosis not present

## 2019-04-15 DIAGNOSIS — K920 Hematemesis: Secondary | ICD-10-CM | POA: Diagnosis present

## 2019-04-15 LAB — COMPREHENSIVE METABOLIC PANEL
ALT: 16 U/L (ref 0–44)
AST: 19 U/L (ref 15–41)
Albumin: 4.2 g/dL (ref 3.5–5.0)
Alkaline Phosphatase: 54 U/L (ref 38–126)
Anion gap: 12 (ref 5–15)
BUN: 17 mg/dL (ref 6–20)
CO2: 24 mmol/L (ref 22–32)
Calcium: 8.9 mg/dL (ref 8.9–10.3)
Chloride: 101 mmol/L (ref 98–111)
Creatinine, Ser: 0.62 mg/dL (ref 0.44–1.00)
GFR calc Af Amer: >60 mL/min (ref >60)
GFR calc non Af Amer: >60 mL/min (ref >60)
Glucose, Bld: 120 mg/dL — ABNORMAL HIGH (ref 70–99)
Potassium: 3.5 mmol/L (ref 3.5–5.1)
Sodium: 137 mmol/L (ref 135–145)
Total Bilirubin: 0.3 mg/dL (ref 0.3–1.2)
Total Protein: 7.6 g/dL (ref 6.5–8.1)

## 2019-04-15 LAB — CBC
HCT: 36.4 % (ref 36.0–46.0)
Hemoglobin: 11.3 g/dL — ABNORMAL LOW (ref 12.0–15.0)
MCH: 27.5 pg (ref 26.0–34.0)
MCHC: 31 g/dL (ref 30.0–36.0)
MCV: 88.6 fL (ref 80.0–100.0)
Platelets: 318 10*3/uL (ref 150–400)
RBC: 4.11 MIL/uL (ref 3.87–5.11)
RDW: 14.3 % (ref 11.5–15.5)
WBC: 6.5 10*3/uL (ref 4.0–10.5)
nRBC: 0 % (ref 0.0–0.2)

## 2019-04-15 LAB — OCCULT BLOOD X 1 CARD TO LAB, STOOL: Fecal Occult Bld: POSITIVE — AB

## 2019-04-15 LAB — SARS CORONAVIRUS 2 BY RT PCR (HOSPITAL ORDER, PERFORMED IN ~~LOC~~ HOSPITAL LAB): SARS Coronavirus 2: NEGATIVE

## 2019-04-15 LAB — LIPASE, BLOOD: Lipase: 25 U/L (ref 11–51)

## 2019-04-15 MED ORDER — SODIUM CHLORIDE 0.9 % IV SOLN
INTRAVENOUS | Status: AC
  Administered 2019-04-15: 22:00:00 via INTRAVENOUS

## 2019-04-15 MED ORDER — LORAZEPAM 2 MG/ML IJ SOLN
1.0000 mg | Freq: Once | INTRAMUSCULAR | Status: AC
  Administered 2019-04-15: 1 mg via INTRAVENOUS
  Filled 2019-04-15: qty 1

## 2019-04-15 MED ORDER — SODIUM CHLORIDE 0.9 % IV BOLUS
1000.0000 mL | Freq: Once | INTRAVENOUS | Status: AC
  Administered 2019-04-15: 1000 mL via INTRAVENOUS

## 2019-04-15 MED ORDER — HYDROMORPHONE HCL 1 MG/ML IJ SOLN
1.0000 mg | Freq: Once | INTRAMUSCULAR | Status: AC
  Administered 2019-04-15: 1 mg via INTRAVENOUS
  Filled 2019-04-15: qty 1

## 2019-04-15 MED ORDER — ONDANSETRON HCL 4 MG/2ML IJ SOLN
4.0000 mg | Freq: Once | INTRAMUSCULAR | Status: AC
  Administered 2019-04-15: 4 mg via INTRAVENOUS
  Filled 2019-04-15: qty 2

## 2019-04-15 MED ORDER — PANTOPRAZOLE SODIUM 40 MG IV SOLR
INTRAVENOUS | Status: AC
Start: 2019-04-15 — End: 2019-04-15
  Administered 2019-04-15: 20:00:00
  Filled 2019-04-15: qty 80

## 2019-04-15 MED ORDER — SODIUM CHLORIDE 0.9 % IV SOLN
80.0000 mg | Freq: Once | INTRAVENOUS | Status: AC
  Administered 2019-04-15: 80 mg via INTRAVENOUS
  Filled 2019-04-15: qty 80

## 2019-04-15 NOTE — ED Notes (Signed)
Pt informed she may not receive a bed assignment for several hours. Comfort measures provided at this time. Pt denies complaints/concerns.

## 2019-04-15 NOTE — ED Notes (Signed)
Pt tearful, pain medication administered

## 2019-04-15 NOTE — ED Notes (Signed)
Pt requesting medication for nausea, EDP [facility] notified

## 2019-04-15 NOTE — ED Notes (Signed)
Consult placed to [facility]

## 2019-04-15 NOTE — ED Triage Notes (Addendum)
Pt with hx duodenal ulcer, past 3 days vomiting, unable to hold any food/fluid.  Took pepto yesterday.reports dark stool today.  Took zofran and protonix without much relief.  Continues with dry heaves

## 2019-04-15 NOTE — ED Notes (Signed)
Pt in waiting room awaiting available ER bed.  Provided urine specimen container if able to provide urine specimen.

## 2019-04-15 NOTE — ED Notes (Signed)
ED Provider at bedside. 

## 2019-04-15 NOTE — Discharge Instructions (Signed)
Admit/transfer to [facility]. Contact Dr Doylene Canard on patient arrival to floor.

## 2019-04-15 NOTE — ED Provider Notes (Signed)
[facility] EMERGENCY DEPARTMENT Provider Note   CSN: [id number] Arrival date & time: 04/15/19  1706     History   Chief Complaint Chief Complaint  Patient presents with  . Emesis    HPI Brienna Bass is a 38 y.o. female.     Patient with remote hx gastric bypass, duodenal ulcer, c/o several episodes of nausea, vomiting, and also epigastric pain. Symptoms acute onset yesterday. After already vomiting several times (not bloody or bilious initially) did note blood in emesis, and also states stools appear dark. Epigastric pain, moderate, persistent, non radiating. No abd distension. No dysuria or gu c/o. No vaginal discharge or bleeding. No back or flank pain. No chest pain or discomfort. No sob. No fever or chills. Compliant w home meds including acid blocker therapy. Denies asa or nsaid use. No anticoag use.   The history is provided by the patient.  Emesis Associated symptoms: abdominal pain   Associated symptoms: no chills, no cough, no fever, no headaches and no sore throat     Past Medical History:  Diagnosis Date  . 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 Arthritis   . Breast cancer ([place])   . Chronic pain   . Fibromyalgia   . IC (interstitial cystitis)   . Ovarian cancer ([place])   . Pulmonary embolism ([place])   . Seizures ([place])   . Spinal compression fracture ([place])   . Thyroid cancer ([place])   . Thyroid cancer ([place])   . Uterine cancer [facility])     Patient Active Problem List   Diagnosis Date Noted  . Bipolar 2 disorder, major depressive episode ([place]) 01/26/2018  . Major depressive disorder, recurrent severe without psychotic features ([place]) 01/24/2018  . GAD (generalized anxiety disorder) 01/24/2018  . Opiate withdrawal ([place])   . Opioid dependence with withdrawal ([place])   . Chronic back pain 02/14/2014  . Chronic neck pain 02/14/2014  . Fibromyalgia affecting multiple sites 02/14/2014  . Other chronic pain 02/14/2014    Past Surgical History:  Procedure  Laterality Date  . ABDOMINAL EXPLORATION SURGERY    . ABDOMINAL HYSTERECTOMY    . APPENDECTOMY    . BREAST LUMPECTOMY    . CHOLECYSTECTOMY    . COMBINED AUGMENTATION MAMMAPLASTY AND ABDOMINOPLASTY    . GASTRIC BYPASS    . I&D EXTREMITY Left 01/18/2015   Procedure: IRRIGATION AND DEBRIDEMENT, PINNING  AND  REPAIR NAIL BED LEFT MIDDLE FINGER;  Surgeon: Leanora Cover, MD;  Location: [facility];  Service: Orthopedics;  Laterality: Left;  . SINUSOTOMY    . THYROIDECTOMY       OB History   No obstetric history on file.      Home Medications    Prior to Admission medications   Medication Sig Start Date End Date Taking? Authorizing Provider  busPIRone (BUSPAR) 15 MG tablet Take 1 tablet (15 mg total) by mouth 3 (three) times daily. 01/30/18   Starkes-Perry, Gayland Curry, FNP  Cyanocobalamin 1000 MCG/ML KIT Inject 1,000 mg as directed every 30 (thirty) days.    [provider]  DULoxetine (CYMBALTA) 60 MG capsule Take 1 capsule (60 mg total) by mouth 2 (two) times daily. 01/30/18   Suella Broad, FNP  ferrous sulfate 325 (65 FE) MG tablet Take 1 tablet (325 mg total) by mouth 2 (two) times daily with a meal. 06/19/16   Carlisle Cater, PA-C  Meth-Hyo-M Bl-Na Phos-Ph Sal (URIBEL) 118 MG CAPS Take 118 mg by mouth as needed (for bladder).  10/02/14  [provider]  Multiple Vitamin (MULTIVITAMIN) capsule Take 1 capsule by mouth daily.     [provider]  Oxcarbazepine (TRILEPTAL) 300 MG tablet Take 1 tablet (300 mg total) by mouth 2 (two) times daily. 01/30/18   Starkes-Perry, Gayland Curry, FNP  pantoprazole (PROTONIX) 40 MG tablet Take 1 tablet (40 mg total) by mouth daily. 01/31/18   Starkes-Perry, Gayland Curry, FNP  pentosan polysulfate (ELMIRON) 100 MG capsule Place 100 mg into the urethra as needed (for bladder).     [provider]  QUEtiapine (SEROQUEL) 200 MG tablet Take 1 tablet (200 mg total) by mouth at bedtime. 01/30/18   Starkes-Perry, Gayland Curry, FNP  solifenacin  (VESICARE) 10 MG tablet Take 10 mg by mouth daily.     [provider]  SUMAtriptan (IMITREX) 50 MG tablet Take 1 tablet (50 mg total) by mouth every 2 (two) hours as needed for migraine or headache. May repeat in 2 hours if headache persists or recurs. 01/30/18   Starkes-Perry, Gayland Curry, FNP  thyroid (ARMOUR) 60 MG tablet Take 60 mg by mouth daily before breakfast.    [provider]  tiZANidine (ZANAFLEX) 4 MG capsule Take 4 mg by mouth 4 (four) times daily.     [provider]  traZODone (DESYREL) 50 MG tablet Take 1 tablet (50 mg total) by mouth at bedtime as needed for sleep. 01/30/18   Suella Broad, FNP    Family History Family History  Problem Relation Age of Onset  . Alcohol abuse Father     Social History Social History   Tobacco Use  . Smoking status: Never Smoker  . Smokeless tobacco: Never Used  Substance Use Topics  . Alcohol use: Yes    Comment: occassionally  . Drug use: Yes    Types: Marijuana     Allergies   Ioxaglate, Ivp dye [iodinated diagnostic agents], Metrizamide, Other, Tramadol, Carbamazepine, Buprenorphine, Divalproex sodium, Levetiracetam, Nsaids, Morphine, Topiramate, and Valproic acid   Review of Systems Review of Systems  Constitutional: Negative for chills and fever.  HENT: Negative for sore throat.   Eyes: Negative for redness.  Respiratory: Negative for cough and shortness of breath.   Cardiovascular: Negative for chest pain.  Gastrointestinal: Positive for abdominal pain, nausea and vomiting.  Endocrine: Negative for polyuria.  Genitourinary: Negative for dysuria and flank pain.  Musculoskeletal: Negative for back pain and neck pain.  Skin: Negative for rash.  Neurological: Negative for headaches.  Hematological: Does not bruise/bleed easily.  Psychiatric/Behavioral: Negative for confusion.     Physical Exam Updated Vital Signs BP (!) 147/92 (BP Location: Right Arm)   Pulse (!) 102   Temp 98.5 F  (36.9 C) (Oral)   Resp 16   Ht 1.702 m (5' 7" )   Wt 63.5 kg   SpO2 99%   BMI 21.93 kg/m   Physical Exam Vitals signs and nursing note reviewed.  Constitutional:      Appearance: Normal appearance. She is well-developed.  HENT:     Head: Atraumatic.     Nose: Nose normal.     Mouth/Throat:     Mouth: Mucous membranes are moist.  Eyes:     General: No scleral icterus.    Conjunctiva/sclera: Conjunctivae normal.  Neck:     Musculoskeletal: Normal range of motion and neck supple. No neck rigidity or muscular tenderness.     Trachea: No tracheal deviation.  Cardiovascular:     Rate and Rhythm: Normal rate and regular rhythm.  Pulses: Normal pulses.     Heart sounds: Normal heart sounds. No murmur. No friction rub. No gallop.   Pulmonary:     Effort: Pulmonary effort is normal. No respiratory distress.     Breath sounds: Normal breath sounds.  Abdominal:     General: Bowel sounds are normal. There is no distension.     Palpations: Abdomen is soft. There is no mass.     Tenderness: There is no abdominal tenderness. There is no guarding or rebound.     Hernia: No hernia is present.  Genitourinary:    Comments: No cva tenderness. Very dark brown stool - tests heme positive.  Musculoskeletal:        General: No swelling or tenderness.  Skin:    General: Skin is warm and dry.     Findings: No rash.  Neurological:     Mental Status: She is alert.     Comments: Alert, speech normal.   Psychiatric:        Mood and Affect: Mood normal.      ED Treatments / Results  Labs (all labs ordered are listed, but only abnormal results are displayed) Results for orders placed or performed during the hospital encounter of 04/15/19  CBC  Result Value Ref Range   WBC 6.5 4.0 - 10.5 K/uL   RBC 4.11 3.87 - 5.11 MIL/uL   Hemoglobin 11.3 (L) 12.0 - 15.0 g/dL   HCT 36.4 36.0 - 46.0 %   MCV 88.6 80.0 - 100.0 fL   MCH 27.5 26.0 - 34.0 pg   MCHC 31.0 30.0 - 36.0 g/dL   RDW 14.3 11.5 -  15.5 %   Platelets 318 150 - 400 K/uL   nRBC 0.0 0.0 - 0.2 %  Comprehensive metabolic panel  Result Value Ref Range   Sodium 137 135 - 145 mmol/L   Potassium 3.5 3.5 - 5.1 mmol/L   Chloride 101 98 - 111 mmol/L   CO2 24 22 - 32 mmol/L   Glucose, Bld 120 (H) 70 - 99 mg/dL   BUN 17 6 - 20 mg/dL   Creatinine, Ser 0.62 0.44 - 1.00 mg/dL   Calcium 8.9 8.9 - 10.3 mg/dL   Total Protein 7.6 6.5 - 8.1 g/dL   Albumin 4.2 3.5 - 5.0 g/dL   AST 19 15 - 41 U/L   ALT 16 0 - 44 U/L   Alkaline Phosphatase 54 38 - 126 U/L   Total Bilirubin 0.3 0.3 - 1.2 mg/dL   GFR calc non Af Amer >60 >60 mL/min   GFR calc Af Amer >60 >60 mL/min   Anion gap 12 5 - 15  Lipase, blood  Result Value Ref Range   Lipase 25 11 - 51 U/L  Occult blood card to lab, stool Provider will collect  Result Value Ref Range   Fecal Occult Bld POSITIVE (A) NEGATIVE    EKG None  Radiology No results found.  Procedures Procedures (including critical care time)  Medications Ordered in ED Medications  pantoprazole (PROTONIX) 80 mg in sodium chloride 0.9 % 100 mL IVPB (has no administration in time range)  ondansetron (ZOFRAN) injection 4 mg (has no administration in time range)  pantoprazole (PROTONIX) 40 MG injection (has no administration in time range)  sodium chloride 0.9 % bolus 1,000 mL (1,000 mLs Intravenous New Bag/Given 04/15/19 1858)     Initial Impression / Assessment and Plan / ED Course  I have reviewed the triage vital signs and the nursing notes.  Pertinent labs & imaging results that were available during my care of the patient were reviewed by me and considered in my medical decision making (see chart for details).  Iv ns bolus. Protonix iv. zofran iv.   Reviewed nursing notes and prior charts for additional history.   Labs sent.  Labs reviewed by me - hgb 11.3 sl low but c/w prior. Chem normal.  Pain persists. Patient also appears quite anxious. Ativan.   Recheck abd soft nt.   Vitals signs  normal  Given hematemesis, heme pos stool, hx duodenal ulcer - will admit.  Pt indicates pcp is Dr Doylene Canard, and no current gi doctor. Dr Doylene Canard consulted for admission - he accepts to [facility].   Recheck, no recurrent vomiting during ED stay, abd exam benign.   Patient appears stable for admission/transfer to [facility].      Final Clinical Impressions(s) / ED Diagnoses   Final diagnoses:  None    ED Discharge Orders    None       Lajean Saver, MD 04/15/19 2210

## 2019-04-16 ENCOUNTER — Inpatient Hospital Stay (HOSPITAL_COMMUNITY): Payer: Medicaid Other | Admitting: Anesthesiology

## 2019-04-16 ENCOUNTER — Encounter (HOSPITAL_COMMUNITY): Payer: Self-pay | Admitting: *Deleted

## 2019-04-16 ENCOUNTER — Encounter (HOSPITAL_COMMUNITY)
Admission: EM | Disposition: A | Discharge status: Home / Self Care | Payer: Self-pay | Source: Home / Self Care | Attending: Cardiovascular Disease

## 2019-04-16 HISTORY — PX: BIOPSY: SHX5522

## 2019-04-16 HISTORY — PX: ESOPHAGOGASTRODUODENOSCOPY (EGD) WITH PROPOFOL: SHX5813

## 2019-04-16 LAB — BASIC METABOLIC PANEL
Anion gap: 5 (ref 5–15)
BUN: 6 mg/dL (ref 6–20)
CO2: 24 mmol/L (ref 22–32)
Calcium: 8.3 mg/dL — ABNORMAL LOW (ref 8.9–10.3)
Chloride: 109 mmol/L (ref 98–111)
Creatinine, Ser: 0.42 mg/dL — ABNORMAL LOW (ref 0.44–1.00)
GFR calc Af Amer: >60 mL/min (ref >60)
GFR calc non Af Amer: >60 mL/min (ref >60)
Glucose, Bld: 102 mg/dL — ABNORMAL HIGH (ref 70–99)
Potassium: 3.8 mmol/L (ref 3.5–5.1)
Sodium: 138 mmol/L (ref 135–145)

## 2019-04-16 LAB — CBC WITH DIFFERENTIAL/PLATELET
Abs Immature Granulocytes: 0.01 10*3/uL (ref 0.00–0.07)
Basophils Absolute: 0 10*3/uL (ref 0.0–0.1)
Basophils Relative: 1 %
Eosinophils Absolute: 0.2 10*3/uL (ref 0.0–0.5)
Eosinophils Relative: 4 %
HCT: 29.6 % — ABNORMAL LOW (ref 36.0–46.0)
Hemoglobin: 9.4 g/dL — ABNORMAL LOW (ref 12.0–15.0)
Immature Granulocytes: 0 %
Lymphocytes Relative: 32 %
Lymphs Abs: 1.5 10*3/uL (ref 0.7–4.0)
MCH: 28.4 pg (ref 26.0–34.0)
MCHC: 31.8 g/dL (ref 30.0–36.0)
MCV: 89.4 fL (ref 80.0–100.0)
Monocytes Absolute: 0.5 10*3/uL (ref 0.1–1.0)
Monocytes Relative: 10 %
Neutro Abs: 2.5 10*3/uL (ref 1.7–7.7)
Neutrophils Relative %: 53 %
Platelets: 227 10*3/uL (ref 150–400)
RBC: 3.31 MIL/uL — ABNORMAL LOW (ref 3.87–5.11)
RDW: 14.3 % (ref 11.5–15.5)
WBC: 4.7 10*3/uL (ref 4.0–10.5)
nRBC: 0 % (ref 0.0–0.2)

## 2019-04-16 SURGERY — ESOPHAGOGASTRODUODENOSCOPY (EGD) WITH PROPOFOL
Anesthesia: Monitor Anesthesia Care

## 2019-04-16 MED ORDER — BUTAMBEN-TETRACAINE-BENZOCAINE 2-2-14 % EX AERO
INHALATION_SPRAY | CUTANEOUS | Status: DC | PRN
  Administered 2019-04-16: 2 via TOPICAL

## 2019-04-16 MED ORDER — PROPOFOL 500 MG/50ML IV EMUL
INTRAVENOUS | Status: DC | PRN
  Administered 2019-04-16: 100 ug/kg/min via INTRAVENOUS

## 2019-04-16 MED ORDER — ONDANSETRON HCL 4 MG/2ML IJ SOLN
4.0000 mg | Freq: Four times a day (QID) | INTRAMUSCULAR | Status: DC | PRN
  Administered 2019-04-16 – 2019-04-19 (×11): 4 mg via INTRAVENOUS
  Filled 2019-04-16 (×11): qty 2

## 2019-04-16 MED ORDER — SODIUM CHLORIDE 0.9 % IV SOLN: INTRAVENOUS | Status: DC

## 2019-04-16 MED ORDER — PROMETHAZINE HCL 25 MG/ML IJ SOLN
6.2500 mg | Freq: Once | INTRAMUSCULAR | Status: AC
  Administered 2019-04-16: 6.25 mg via INTRAVENOUS

## 2019-04-16 MED ORDER — LACTATED RINGERS IV SOLN
INTRAVENOUS | Status: DC
  Administered 2019-04-16: 1000 mL via INTRAVENOUS

## 2019-04-16 MED ORDER — MIDAZOLAM HCL 5 MG/5ML IJ SOLN
INTRAMUSCULAR | Status: DC | PRN
  Administered 2019-04-16: 2 mg via INTRAVENOUS

## 2019-04-16 MED ORDER — HALOPERIDOL LACTATE 5 MG/ML IJ SOLN
2.0000 mg | Freq: Once | INTRAMUSCULAR | Status: AC
  Administered 2019-04-16: 2 mg via INTRAVENOUS
  Filled 2019-04-16: qty 1

## 2019-04-16 MED ORDER — HYDROMORPHONE HCL 1 MG/ML IJ SOLN
1.0000 mg | Freq: Once | INTRAMUSCULAR | Status: AC
  Administered 2019-04-16: 1 mg via INTRAVENOUS
  Filled 2019-04-16: qty 1

## 2019-04-16 MED ORDER — DULOXETINE HCL 60 MG PO CPEP
60.0000 mg | ORAL_CAPSULE | Freq: Two times a day (BID) | ORAL | Status: DC
  Administered 2019-04-16 – 2019-04-19 (×7): 60 mg via ORAL
  Filled 2019-04-16 (×7): qty 1

## 2019-04-16 MED ORDER — SUMATRIPTAN SUCCINATE 50 MG PO TABS
50.0000 mg | ORAL_TABLET | ORAL | Status: DC | PRN
  Filled 2019-04-16: qty 1

## 2019-04-16 MED ORDER — OXCARBAZEPINE 300 MG PO TABS
300.0000 mg | ORAL_TABLET | Freq: Two times a day (BID) | ORAL | Status: DC
  Administered 2019-04-16 – 2019-04-19 (×7): 300 mg via ORAL
  Filled 2019-04-16 (×8): qty 1

## 2019-04-16 MED ORDER — PANTOPRAZOLE SODIUM 40 MG IV SOLR
40.0000 mg | Freq: Two times a day (BID) | INTRAVENOUS | Status: DC
  Administered 2019-04-16 – 2019-04-19 (×7): 40 mg via INTRAVENOUS
  Filled 2019-04-16 (×7): qty 40

## 2019-04-16 MED ORDER — PROMETHAZINE HCL 25 MG/ML IJ SOLN
INTRAMUSCULAR | Status: AC
  Filled 2019-04-16: qty 1

## 2019-04-16 MED ORDER — THYROID 30 MG PO TABS
60.0000 mg | ORAL_TABLET | Freq: Every day | ORAL | Status: DC
  Administered 2019-04-17 – 2019-04-18 (×2): 60 mg via ORAL
  Filled 2019-04-16: qty 1
  Filled 2019-04-16: qty 2
  Filled 2019-04-16: qty 1
  Filled 2019-04-16 (×3): qty 2
  Filled 2019-04-16: qty 1

## 2019-04-16 MED ORDER — OXYCODONE HCL 5 MG PO TABS
5.0000 mg | ORAL_TABLET | Freq: Once | ORAL | Status: AC
  Administered 2019-04-16: 5 mg via ORAL
  Filled 2019-04-16: qty 1

## 2019-04-16 SURGICAL SUPPLY — 15 items

## 2019-04-16 NOTE — H&P (Addendum)
Referring Physician: Dixie Dials, MD  Alexis Singleton is an 38 y.o. female.                       Chief Complaint: Emesis  HPI: 38 years old white female with PMH of gastric bypass, duodenal ulcer, Aplastic anemia, fibromyalgia, Chronic pain, breast cancer, thyroid cancer and uterine cancer had several episodes of nausea, vomiting and epigastric pain. She also noticed her stools were dark and had some blood in vomitus. No chest pain, fever or chills or shortness of breath.  She denies NSAID or aspirin use but took tylenol PM and few cups of Coffee. She uses PO Protonix on daily basis.  Past Medical History:  Diagnosis Date  . 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 Arthritis   . Breast cancer ([place])   . Chronic pain   . Fibromyalgia   . IC (interstitial cystitis)   . Ovarian cancer ([place])   . Pulmonary embolism ([place])   . Seizures ([place])   . Spinal compression fracture ([place])   . Thyroid cancer ([place])   . Thyroid cancer ([place])   . Uterine cancer [facility])       Past Surgical History:  Procedure Laterality Date  . ABDOMINAL EXPLORATION SURGERY    . ABDOMINAL HYSTERECTOMY    . APPENDECTOMY    . BREAST LUMPECTOMY    . CHOLECYSTECTOMY    . COMBINED AUGMENTATION MAMMAPLASTY AND ABDOMINOPLASTY    . GASTRIC BYPASS    . I&D EXTREMITY Left 01/18/2015   Procedure: IRRIGATION AND DEBRIDEMENT, PINNING  AND  REPAIR NAIL BED LEFT MIDDLE FINGER;  Surgeon: Leanora Cover, MD;  Location: [facility];  Service: Orthopedics;  Laterality: Left;  . SINUSOTOMY    . THYROIDECTOMY      Family History  Problem Relation Age of Onset  . Alcohol abuse Father    Social History:  reports that she has never smoked. She has never used smokeless tobacco. She reports current alcohol use. She reports current drug use. Drug: Marijuana.  Allergies:  Allergies  Allergen Reactions  . Ioxaglate Anaphylaxis  . Ivp Dye [Iodinated Diagnostic Agents] Anaphylaxis  . Metrizamide Anaphylaxis  . Other Anaphylaxis    Seafood   .  Tramadol Other (See Comments)    Seizures  . Carbamazepine Other (See Comments)    Headache  . Buprenorphine Nausea Only  . Divalproex Sodium Other (See Comments)    Increased seizure activity  . Levetiracetam Other (See Comments)    Increased seizure activity  . Nsaids Other (See Comments)    Gastric bypass patient  . Morphine Itching  . Topiramate Nausea Only  . Valproic Acid Nausea Only    Medications Prior to Admission  Medication Sig Dispense Refill  . Cyanocobalamin 1000 MCG/ML KIT Inject 1,000 mg as directed every 30 (thirty) days.    . DULoxetine (CYMBALTA) 60 MG capsule Take 1 capsule (60 mg total) by mouth 2 (two) times daily. 60 capsule 0  . ferrous sulfate 325 (65 FE) MG tablet Take 1 tablet (325 mg total) by mouth 2 (two) times daily with a meal. 60 tablet 1  . Multiple Vitamin (MULTIVITAMIN) capsule Take 1 capsule by mouth daily.     . Oxcarbazepine (TRILEPTAL) 300 MG tablet Take 1 tablet (300 mg total) by mouth 2 (two) times daily. 60 tablet 0  . pantoprazole (PROTONIX) 40 MG tablet Take 1 tablet (40 mg total) by mouth daily. 30 tablet 0  . pentosan polysulfate (ELMIRON) 100 MG  capsule Place 100 mg into the urethra as needed (for bladder).     . SUMAtriptan (IMITREX) 50 MG tablet Take 1 tablet (50 mg total) by mouth every 2 (two) hours as needed for migraine or headache. May repeat in 2 hours if headache persists or recurs. 10 tablet 0  . thyroid (ARMOUR) 60 MG tablet Take 60 mg by mouth daily before breakfast.    . busPIRone (BUSPAR) 15 MG tablet Take 1 tablet (15 mg total) by mouth 3 (three) times daily. 90 tablet 0  . Meth-Hyo-M Bl-Na Phos-Ph Sal (URIBEL) 118 MG CAPS Take 118 mg by mouth as needed (for bladder).     . QUEtiapine (SEROQUEL) 200 MG tablet Take 1 tablet (200 mg total) by mouth at bedtime. 30 tablet 0  . solifenacin (VESICARE) 10 MG tablet Take 10 mg by mouth daily.     Marland Kitchen tiZANidine (ZANAFLEX) 4 MG capsule Take 4 mg by mouth 4 (four) times daily.     .  traZODone (DESYREL) 50 MG tablet Take 1 tablet (50 mg total) by mouth at bedtime as needed for sleep. (Patient not taking: Reported on 04/16/2019) 30 tablet 0    Results for orders placed or performed during the hospital encounter of 04/15/19 (from the past 48 hour(s))  CBC     Status: Abnormal   Collection Time: 04/15/19  6:52 PM  Result Value Ref Range   WBC 6.5 4.0 - 10.5 K/uL   RBC 4.11 3.87 - 5.11 MIL/uL   Hemoglobin 11.3 (L) 12.0 - 15.0 g/dL   HCT 36.4 36.0 - 46.0 %   MCV 88.6 80.0 - 100.0 fL   MCH 27.5 26.0 - 34.0 pg   MCHC 31.0 30.0 - 36.0 g/dL   RDW 14.3 11.5 - 15.5 %   Platelets 318 150 - 400 K/uL   nRBC 0.0 0.0 - 0.2 %    Comment: Performed at [facility], [address] [place], [place] [address]  Comprehensive metabolic panel     Status: Abnormal   Collection Time: 04/15/19  6:52 PM  Result Value Ref Range   Sodium 137 135 - 145 mmol/L   Potassium 3.5 3.5 - 5.1 mmol/L   Chloride 101 98 - 111 mmol/L   CO2 24 22 - 32 mmol/L   Glucose, Bld 120 (H) 70 - 99 mg/dL   BUN 17 6 - 20 mg/dL   Creatinine, Ser 0.62 0.44 - 1.00 mg/dL   Calcium 8.9 8.9 - 10.3 mg/dL   Total Protein 7.6 6.5 - 8.1 g/dL   Albumin 4.2 3.5 - 5.0 g/dL   AST 19 15 - 41 U/L   ALT 16 0 - 44 U/L   Alkaline Phosphatase 54 38 - 126 U/L   Total Bilirubin 0.3 0.3 - 1.2 mg/dL   GFR calc non Af Amer >60 >60 mL/min   GFR calc Af Amer >60 >60 mL/min   Anion gap 12 5 - 15    Comment: Performed at [facility], [address] [place], [place] [address]  Lipase, blood     Status: None   Collection Time: 04/15/19  6:52 PM  Result Value Ref Range   Lipase 25 11 - 51 U/L    Comment: Performed at [facility], [address] [place], [place] [address]  Occult blood card to lab, stool Provider will collect     Status: Abnormal   Collection Time: 04/15/19  6:53 PM  Result Value  Ref Range   Fecal Occult Bld POSITIVE (A) NEGATIVE    Comment: Performed at [facility], [address] [place], [place] [address]  SARS Coronavirus 2 [facility] order, Performed in [facility] hospital lab) Nasopharyngeal Nasopharyngeal Swab     Status: None   Collection Time: 04/15/19 10:04 PM   Specimen: Nasopharyngeal Swab  Result Value Ref Range   SARS Coronavirus 2 NEGATIVE NEGATIVE    Comment: (NOTE) If result is NEGATIVE SARS-CoV-2 target nucleic acids are NOT DETECTED. The SARS-CoV-2 RNA is generally detectable in upper and lower  respiratory specimens during the acute phase of infection. The lowest  concentration of SARS-CoV-2 viral copies this assay can detect is 250  copies / mL. A negative result does not preclude SARS-CoV-2 infection  and should not be used as the sole basis for treatment or other  patient management decisions.  A negative result may occur with  improper specimen collection / handling, submission of specimen other  than nasopharyngeal swab, presence of viral mutation(s) within the  areas targeted by this assay, and inadequate number of viral copies  (<250 copies / mL). A negative result must be combined with clinical  observations, patient history, and epidemiological information. If result is POSITIVE SARS-CoV-2 target nucleic acids are DETECTED. The SARS-CoV-2 RNA is generally detectable in upper and lower  respiratory specimens dur ing the acute phase of infection.  Positive  results are indicative of active infection with SARS-CoV-2.  Clinical  correlation with patient history and other diagnostic information is  necessary to determine patient infection status.  Positive results do  not rule out bacterial infection or co-infection with other viruses. If result is PRESUMPTIVE POSTIVE SARS-CoV-2 nucleic acids MAY BE PRESENT.   A presumptive positive result was obtained on the submitted specimen  and confirmed on repeat testing.  While 2019 novel coronavirus  (SARS-CoV-2) nucleic acids may be present in the submitted sample  additional  confirmatory testing may be necessary for epidemiological  and / or clinical management purposes  to differentiate between  SARS-CoV-2 and other Sarbecovirus currently known to infect humans.  If clinically indicated additional testing with an alternate test  methodology [phone]) is advised. The SARS-CoV-2 RNA is generally  detectable in upper and lower respiratory sp ecimens during the acute  phase of infection. The expected result is Negative. Fact Sheet for Patients:  StrictlyIdeas.no Fact Sheet for Healthcare Providers: BankingDealers.co.za This test is not yet approved or cleared by the [place] FDA and has been authorized for detection and/or diagnosis of SARS-CoV-2 by FDA under an Emergency Use Authorization (EUA).  This EUA will remain in effect (meaning this test can be used) for the duration of the COVID-19 declaration under Section 564(b)(1) of the Act, 21 U.S.C. section 360bbb-3(b)(1), unless the authorization is terminated or revoked sooner. Performed at [facility], [address] [place], [place] [address]    No results found.  Review Of Systems Constitutional: No fever, chills, weight loss or gain. Eyes: No vision change, No discharge or pain. Ears: No hearing loss, No tinnitus. Respiratory: No asthma, COPD, pneumonias. No shortness of breath. No hemoptysis. Cardiovascular: Occasional chest pain, palpitation, no leg edema. Gastrointestinal: Positive nausea, vomiting, diarrhea, constipation. H/O GI bleed. No hepatitis. Genitourinary: No dysuria, hematuria, kidney stone. No incontinance. Neurological: Positive headache, no stroke, positive seizures.  Psychiatry: No psych facility admission for anxiety, depression, suicide. No detox. Skin: No rash. Musculoskeletal: Positive joint pain, fibromyalgia, neck pain, back pain.  Lymphadenopathy: No lymphadenopathy. Hematology: H/O anemia or easy  bruising.   Blood pressure (!) 98/48, pulse 70, temperature 98.2 F (36.8 C), temperature source Oral, resp. rate 18, height _0  (1.702 m), weight 63.5 kg, SpO2 98 %. Body mass index is 21.93 kg/m. General appearance: alert, cooperative, appears stated age and no distress Head: Normocephalic, atraumatic. Eyes: Hazel eyes, pale conjunctiva, corneas clear. PERRL, EOM's intact. Neck: No adenopathy, no carotid bruit, no JVD, supple, symmetrical, trachea midline and thyroid not enlarged. Resp: Clear to auscultation bilaterally. Cardio: Regular rate and rhythm, S1, S2 normal, II/VI systolic murmur, no click, rub or gallop GI: Soft, epigastric area-tender; bowel sounds normal; no organomegaly. Extremities: No edema, cyanosis or clubbing. Skin: Warm and dry.  Neurologic: Alert and oriented X 3, normal strength. Normal coordination.  Assessment/Plan Acute upper GI bleed H/O duodenal ulcer S/P gastric bypass Anemia of acute blood loss H/O aplastic anemia Seizure disorder Hypothyroidism H/O migraine headaches Chronic pain and fibromyalgia Depression  NPO GI consult Recheck CBC and BMET Continue IV fluids Home medications  Time spent: Review of old records, Lab, x-rays, EKG, other cardiac tests, examination, discussion with patient over 70 minutes.  Birdie Riddle, MD  04/16/2019, 9:10 AM

## 2019-04-16 NOTE — ED Notes (Signed)
Provided update, apologized for delay.

## 2019-04-16 NOTE — ED Notes (Signed)
Pt aware that no inpatient bed is available until in the morning.

## 2019-04-16 NOTE — Anesthesia Postprocedure Evaluation (Signed)
Anesthesia Post Note  Patient: Alexis Singleton  Procedure(s) Performed: ESOPHAGOGASTRODUODENOSCOPY (EGD) WITH PROPOFOL (N/A ) BIOPSY     Patient location during evaluation: PACU Anesthesia Type: MAC Level of consciousness: awake and alert and oriented Pain management: pain level controlled Vital Signs Assessment: post-procedure vital signs reviewed and stable Respiratory status: spontaneous breathing, nonlabored ventilation and respiratory function stable Cardiovascular status: blood pressure returned to baseline Postop Assessment: no apparent nausea or vomiting Anesthetic complications: no    Last Vitals:  Vitals:   04/16/19 1520 04/16/19 1646  BP: 128/85 98/60  Pulse: 70 79  Resp: 19 18  Temp:  36.8 C  SpO2: 100% 99%    Last Pain:  Vitals:   04/16/19 1646  TempSrc: Oral  PainSc:                  Brennan Bailey

## 2019-04-16 NOTE — Anesthesia Preprocedure Evaluation (Addendum)
Anesthesia Evaluation  Patient identified by MRN, date of birth, ID band Patient awake    Reviewed: Allergy & Precautions, NPO status , Patient's Chart, lab work & pertinent test results  History of Anesthesia Complications Negative for: history of anesthetic complications  Airway Mallampati: I  TM Distance: >3 FB Neck ROM: Full    Dental  (+) Missing,    Pulmonary neg pulmonary ROS,    Pulmonary exam normal        Cardiovascular negative cardio ROS Normal cardiovascular exam     Neuro/Psych Seizures -,  Anxiety Depression Bipolar Disorder negative psych ROS   GI/Hepatic GERD  Medicated and Controlled,(+)     substance abuse  marijuana use, Upper GI bleed   Endo/Other  negative endocrine ROS  Renal/GU negative Renal ROS  negative genitourinary   Musculoskeletal  (+) Arthritis , Fibromyalgia -  Abdominal   Peds  Hematology  (+) anemia , Aplastic anemia, Hgb 9.4   Anesthesia Other Findings Hx of breast, uterine, and thyroid ca  Reproductive/Obstetrics negative OB ROS                          Anesthesia Physical Anesthesia Plan  ASA: III  Anesthesia Plan: MAC   Post-op Pain Management:    Induction:   PONV Risk Score and Plan: 2 and Treatment may vary due to age or medical condition and Propofol infusion  Airway Management Planned: Natural Airway and Nasal Cannula  Additional Equipment: None  Intra-op Plan:   Post-operative Plan:   Informed Consent: I have reviewed the patients History and Physical, chart, labs and discussed the procedure including the risks, benefits and alternatives for the proposed anesthesia with the patient or authorized representative who has indicated his/her understanding and acceptance.     Dental advisory given  Plan Discussed with: CRNA  Anesthesia Plan Comments:        Anesthesia Quick Evaluation

## 2019-04-16 NOTE — Consult Note (Signed)
Referring Provider:  Dr. Doylene Canard  Primary Care Physician:  Dixie Dials, MD Primary Gastroenterologist: Althia Forts  Reason for Consultation: GI bleed/melena  HPI: Alexis Singleton is a 38 y.o. 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  Transferred to [facility] for further evaluation.  Patient started having nausea and vomiting 4 days ago.  This was followed by coffee-ground emesis and hematemesis.  Yesterday she started noticing black-colored stool.  She did use some Pepto-Bismol prior to having dark stools.  Complaining of epigastric abdominal pain.  Denies any bright red blood per rectum.  Complaining of diarrhea alternating with constipation  Last colonoscopy several years ago.  Report not available to review at this time.  Multiple distant family members with colon cancer.  She denies NSAID use  Past Medical History:  Diagnosis Date  . Anemia aplastic aregenerative ([place])   . Arthritis   . Breast cancer ([place])   . Chronic pain   . Fibromyalgia   . IC (interstitial cystitis)   . Ovarian cancer ([place])   . Pulmonary embolism ([place])   . Seizures ([place])   . Spinal compression fracture ([place])   . Thyroid cancer ([place])   . Thyroid cancer ([place])   . Uterine cancer [facility])     Past Surgical History:  Procedure Laterality Date  . ABDOMINAL EXPLORATION SURGERY    . ABDOMINAL HYSTERECTOMY    . APPENDECTOMY    . BREAST LUMPECTOMY    . CHOLECYSTECTOMY    . COMBINED AUGMENTATION MAMMAPLASTY AND ABDOMINOPLASTY    . GASTRIC BYPASS    . I&D EXTREMITY Left 01/18/2015   Procedure: IRRIGATION AND DEBRIDEMENT, PINNING  AND  REPAIR NAIL BED LEFT MIDDLE FINGER;  Surgeon: Leanora Cover, MD;   Location: [facility];  Service: Orthopedics;  Laterality: Left;  . SINUSOTOMY    . THYROIDECTOMY      Prior to Admission medications   Medication Sig Start Date End Date Taking? Authorizing Provider  Cyanocobalamin 1000 MCG/ML KIT Inject 1,000 mg as directed every 30 (thirty) days.   Yes [provider]  DULoxetine (CYMBALTA) 60 MG capsule Take 1 capsule (60 mg total) by mouth 2 (two) times daily. 01/30/18  Yes Starkes-Perry, Gayland Curry, FNP  ferrous sulfate 325 (65 FE) MG tablet Take 1 tablet (325 mg total) by mouth 2 (two) times daily with a meal. 06/19/16  Yes Carlisle Cater, PA-C  Meth-Hyo-M Bl-Na Phos-Ph Sal (URIBEL) 118 MG CAPS Take 118 mg by mouth as needed (for bladder).  10/02/14  Yes [provider]  Multiple Vitamin (MULTIVITAMIN) capsule Take 1 capsule by mouth daily.    Yes [provider]  ondansetron (ZOFRAN-ODT) 4 MG disintegrating tablet Take 4 mg by mouth every 8 (eight) hours as needed for nausea or vomiting.   Yes [provider]  Oxcarbazepine (TRILEPTAL) 300 MG tablet Take 1 tablet (300 mg total) by mouth 2 (two) times daily. 01/30/18  Yes Starkes-Perry, Gayland Curry, FNP  pantoprazole (PROTONIX) 40 MG tablet Take 1 tablet (40 mg total) by mouth daily. 01/31/18  Yes Starkes-Perry, Gayland Curry, FNP  pentosan polysulfate (ELMIRON) 100 MG capsule Place 100 mg into the urethra as needed (for bladder).    Yes [provider]  solifenacin (VESICARE) 10  MG tablet Take 10 mg by mouth daily as needed (bladder).    Yes [provider]  sucralfate (CARAFATE) 1 g tablet Take 1 g by mouth 4 (four) times daily -  with meals and at bedtime.   Yes [provider]  SUMAtriptan (IMITREX) 50 MG tablet Take 1 tablet (50 mg total) by mouth every 2 (two) hours as needed for migraine or headache. May repeat in 2 hours if headache persists or recurs. 01/30/18  Yes Starkes-Perry, Gayland Curry, FNP  thyroid (ARMOUR) 60 MG tablet Take 60 mg by mouth daily before  breakfast.   Yes [provider]  busPIRone (BUSPAR) 15 MG tablet Take 1 tablet (15 mg total) by mouth 3 (three) times daily. Patient not taking: Reported on 04/16/2019 01/30/18   Suella Broad, FNP  QUEtiapine (SEROQUEL) 200 MG tablet Take 1 tablet (200 mg total) by mouth at bedtime. Patient not taking: Reported on 04/16/2019 01/30/18   Suella Broad, FNP  traZODone (DESYREL) 50 MG tablet Take 1 tablet (50 mg total) by mouth at bedtime as needed for sleep. Patient not taking: Reported on 04/16/2019 01/30/18   Suella Broad, FNP    Scheduled Meds: Continuous Infusions: PRN Meds:.  Allergies as of 04/15/2019 - Review Complete 04/15/2019  Allergen Reaction Noted  . Ioxaglate Anaphylaxis 01/18/2015  . Ivp dye [iodinated diagnostic agents] Anaphylaxis 09/19/2012  . Metrizamide Anaphylaxis 06/19/2016  . Other Anaphylaxis 02/14/2014  . Tramadol Other (See Comments) 01/18/2015  . Carbamazepine Other (See Comments) 01/18/2015  . Buprenorphine Nausea Only 06/19/2016  . Divalproex sodium Other (See Comments) 06/19/2016  . Levetiracetam Other (See Comments) 06/19/2016  . Nsaids Other (See Comments) 06/19/2016  . Morphine Itching 01/18/2015  . Topiramate Nausea Only 01/18/2015  . Valproic acid Nausea Only 01/18/2015    Family History  Problem Relation Age of Onset  . Alcohol abuse Father     Social History   Socioeconomic History  . Marital status: Legally Separated    Spouse name: Not on file  . Number of children: Not on file  . Years of education: Not on file  . Highest education level: Not on file  Occupational History  . Not on file  Social Needs  . Financial resource strain: Not on file  . Food insecurity    Worry: Not on file    Inability: Not on file  . Transportation needs    Medical: Not on file    Non-medical: Not on file  Tobacco Use  . Smoking status: Never Smoker  . Smokeless tobacco: Never Used  Substance and Sexual Activity  .  Alcohol use: Yes    Comment: occassionally  . Drug use: Yes    Types: Marijuana  . Sexual activity: Not Currently  Lifestyle  . Physical activity    Days per week: Not on file    Minutes per session: Not on file  . Stress: Not on file  Relationships  . Social Herbalist on phone: Not on file    Gets together: Not on file    Attends religious service: Not on file    Active member of club or organization: Not on file    Attends meetings of clubs or organizations: Not on file    Relationship status: Not on file  . Intimate partner violence    Fear of current or ex partner: Not on file    Emotionally abused: Not on file    Physically abused: Not on file  Forced sexual activity: Not on file  Other Topics Concern  . Not on file  Social History Narrative  . Not on file    Review of Systems: Review of Systems  Constitutional: Positive for malaise/fatigue. Negative for chills and fever.  HENT: Negative for hearing loss and tinnitus.   Eyes: Negative for blurred vision and double vision.  Respiratory: Negative for cough and hemoptysis.   Cardiovascular: Negative for chest pain and palpitations.  Gastrointestinal: Positive for abdominal pain, blood in stool, constipation, diarrhea, heartburn, melena, nausea and vomiting.  Genitourinary: Negative for dysuria and urgency.  Musculoskeletal: Positive for back pain. Negative for myalgias.  Skin: Negative for itching and rash.  Neurological: Negative for seizures and loss of consciousness.  Endo/Heme/Allergies: Does not bruise/bleed easily.  Psychiatric/Behavioral: Negative for hallucinations. The patient is not nervous/anxious.     Physical Exam: Vital signs: Vitals:   04/16/19 0806 04/16/19 0904  BP: (!) 98/56 (!) 98/48  Pulse: 70 70  Resp: 16 18  Temp: 98.3 F (36.8 C) 98.2 F (36.8 C)  SpO2: 98% 98%     Physical Exam  Constitutional: She is oriented to person, place, and time. She appears well-developed and  well-nourished. No distress.  HENT:  Head: Normocephalic and atraumatic.  Eyes: Pupils are equal, round, and reactive to light. EOM are normal.  Neck: Normal range of motion. Neck supple.  Cardiovascular: Normal rate, regular rhythm and normal heart sounds.  Pulmonary/Chest: Effort normal and breath sounds normal.  Abdominal: Soft. Bowel sounds are normal. She exhibits no distension. There is abdominal tenderness. There is no rebound and no guarding.  Midline scar mark noted with epigastric tenderness to palpation  Musculoskeletal: Normal range of motion.        General: No edema.  Neurological: She is alert and oriented to person, place, and time.  Skin: Skin is warm. No erythema.  Psychiatric: She has a normal mood and affect. Thought content normal.    GI:  Lab Results: Recent Labs    04/15/19 1852  WBC 6.5  HGB 11.3*  HCT 36.4  PLT 318   BMET Recent Labs    04/15/19 1852  NA 137  K 3.5  CL 101  CO2 24  GLUCOSE 120*  BUN 17  CREATININE 0.62  CALCIUM 8.9   LFT Recent Labs    04/15/19 1852  PROT 7.6  ALBUMIN 4.2  AST 19  ALT 16  ALKPHOS 54  BILITOT 0.3   PT/INR No results for input(s): LABPROT, INR in the last 72 hours.   Studies/Results: No results found.  Impression/Plan: -Hematemesis and melena.  Patient with history of duodenal ulcer as well as history of Roux-en-Y gastric bypass.  Most likely anastomotic ulcer or gastritis. -History of duodenal ulcer.  Currently on PPI.  Denies NSAID use -History of breast and uterine cancer.  Status post lumpectomy and hysterectomy. -Family history of colon cancer in multiple distant relatives.  Recommendations --------------------------- -Recheck CBC.  Continue PPI.  Plan for EGD later today.  -Recommend colonoscopy in near future.     Risks (bleeding, infection, bowel perforation that could require surgery, sedation-related changes in cardiopulmonary systems), benefits (identification and possible  treatment of source of symptoms, exclusion of certain causes of symptoms), and alternatives (watchful waiting, radiographic imaging studies, empiric medical treatment)  were explained to patient in detail and patient wishes to proceed.    LOS: 1 day   Otis Brace  MD, FACP 04/16/2019, 9:16 AM  Contact #  [phone]

## 2019-04-16 NOTE — ED Notes (Signed)
Report given.

## 2019-04-16 NOTE — ED Notes (Signed)
Report provided to [facility].  Pt reports increased pain 7/10, Dr Venita Sheffield made aware, orders received.

## 2019-04-16 NOTE — Transfer of Care (Signed)
Immediate Anesthesia Transfer of Care Note  Patient: Alexis Singleton  Procedure(s) Performed: ESOPHAGOGASTRODUODENOSCOPY (EGD) WITH PROPOFOL (N/A ) BIOPSY  Patient Location: PACU  Anesthesia Type:MAC  Level of Consciousness: awake and alert   Airway & Oxygen Therapy: Patient Spontanous Breathing  Post-op Assessment: Report given to RN and Post -op Vital signs reviewed and stable  Post vital signs: Reviewed and stable  Last Vitals:  Vitals Value Taken Time  BP 136/73 04/16/19 1513  Temp 36.3 C 04/16/19 1505  Pulse 78 04/16/19 1513  Resp 18 04/16/19 1513  SpO2 100 % 04/16/19 1513  Vitals shown include unvalidated device data.  Last Pain:  Vitals:   04/16/19 1505  TempSrc: Temporal  PainSc: 2       Patients Stated Pain Goal: 0 (43/83/81 8403)  Complications: No apparent anesthesia complications

## 2019-04-16 NOTE — Op Note (Signed)
[facility] Patient Name: Alexis Singleton Procedure Date : 04/16/2019 MRN: [id number] Attending MD: Otis Brace , MD Date of Birth: 03-Jan-1981 CSN: [id number] Age: 38 Admit Type: Inpatient Procedure:                Upper GI endoscopy Indications:              Coffee-ground emesis, Hematemesis, Melena Providers:                Otis Brace, MD, Josie Dixon, RN, Jobe Igo, RN, Lazaro Arms, Technician, Marguerita Merles, Technician, Eligha Bridegroom CRNA, CRNA Referring MD:              Medicines:                Sedation Administered by an Anesthesia Professional Complications:            No immediate complications. Estimated Blood Loss:     Estimated blood loss was minimal. Procedure:                Pre-Anesthesia Assessment:                           - Prior to the procedure, a History and Physical                            was performed, and patient medications and                            allergies were reviewed. The patient's tolerance of                            previous anesthesia was also reviewed. The risks                            and benefits of the procedure and the sedation                            options and risks were discussed with the patient.                            All questions were answered, and informed consent                            was obtained. Prior Anticoagulants: The patient has                            taken no previous anticoagulant or antiplatelet                            agents. ASA Grade Assessment: III - A patient with  severe systemic disease. After reviewing the risks                            and benefits, the patient was deemed in                            satisfactory condition to undergo the procedure.                           After obtaining informed consent, the endoscope was                            passed under  direct vision. Throughout the                            procedure, the patient's blood pressure, pulse, and                            oxygen saturations were monitored continuously. The                            [id number]) Olympus gastroscope was                            introduced through the mouth, and advanced to the                            third part of duodenum. The upper GI endoscopy was                            accomplished without difficulty. The patient                            tolerated the procedure well. Scope In: Scope Out: Findings:      The Z-line was regular and was found 35 cm from the incisors.      The exam of the esophagus was otherwise normal.      Evidence of a patent Billroth I gastroduodenostomy was found. A gastric       pouch with a small size was found. The gastroduodenal anastomosis was       characterized by ulceration. This was traversed. Biopsies were taken       with a cold forceps for histology.      Scattered mild inflammation characterized by congestion (edema) and       erythema was found in the gastric body. Biopsies were taken with a cold       forceps for Helicobacter pylori testing.      The examined duodenum was normal. Impression:               - Z-line regular, 35 cm from the incisors.                           - Patent Billroth I gastroduodenostomy was found,  characterized by ulceration. Biopsied.                           - Gastritis. Biopsied.                           - Normal examined duodenum. Recommendation:           - Return patient to hospital ward for ongoing care.                           - Soft diet.                           - Use Protonix (pantoprazole) 40 mg PO BID for 2                            weeks.                           - Await pathology results.                           - Return to my office in 4 weeks. Procedure Code(s):        --- Professional ---                            [phone], Esophagogastroduodenoscopy, flexible,                            transoral; with biopsy, single or multiple Diagnosis Code(s):        --- Professional ---                           Z98.0, Intestinal bypass and anastomosis status                           K29.70, Gastritis, unspecified, without bleeding                           K92.0, Hematemesis                           K92.1, Melena (includes Hematochezia) CPT copyright 2019 American Medical Association. All rights reserved. The codes documented in this report are preliminary and upon coder review may  be revised to meet current compliance requirements. Otis Brace, MD Otis Brace, MD 04/16/2019 3:04:20 PM Number of Addenda: 0

## 2019-04-16 NOTE — Brief Op Note (Signed)
04/15/2019 - 04/16/2019  3:05 PM  PATIENT:  Alexis Singleton  38 y.o. female  PRE-OPERATIVE DIAGNOSIS:  GI bleed  POST-OPERATIVE DIAGNOSIS:  EGD: anastomotic ulcer and gastritis  PROCEDURE:  Procedure(s): ESOPHAGOGASTRODUODENOSCOPY (EGD) WITH PROPOFOL (N/A) BIOPSY  SURGEON:  Surgeon(s) and Role:    * Reta Norgren, MD - Primary  Findings ---------- -EGD showed small clean based nonbleeding anastomotic ulcer.  Anatomy consistent with previous Billroth I gastric bypass.  Mild gastritis.  Biopsies taken.  Recommendations ------------------------- -Advance diet. -Recommend pantoprazole 40 mg twice a day for 2 weeks followed by 40 mg once a day. -Okay to use Carafate 1 g 2-3 times a day for 2 weeks. -Follow-up in GI clinic in 4 to 6 weeks after discharge. -Okay to discharge from GI standpoint tomorrow if hemoglobin stable.  GI will sign off.  Call us back if needed  Otis Brace MD, [place] 04/16/2019, 3:06 PM  Contact #  [phone]

## 2019-04-16 NOTE — ED Notes (Signed)
[facility] aware that pt has a ready bed. Report called to Otila Kluver, RN over at [facility]

## 2019-04-16 NOTE — ED Notes (Signed)
Report given to Tina  ?

## 2019-04-16 NOTE — Anesthesia Procedure Notes (Signed)
Procedure Name: MAC Date/Time: 04/16/2019 3:15 PM Performed by: Eligha Bridegroom, CRNA Pre-anesthesia Checklist: Patient identified, Emergency Drugs available, Patient being monitored and Suction available Patient Re-evaluated:Patient Re-evaluated prior to induction Induction Type: IV induction

## 2019-04-16 NOTE — ED Notes (Signed)
Pt up to bathroom. C/o general abd pain. Dr. Dina Rich aware.

## 2019-04-17 ENCOUNTER — Other Ambulatory Visit: Payer: Self-pay

## 2019-04-17 LAB — CBC WITH DIFFERENTIAL/PLATELET
Abs Immature Granulocytes: 0.02 10*3/uL (ref 0.00–0.07)
Basophils Absolute: 0 10*3/uL (ref 0.0–0.1)
Basophils Relative: 1 %
Eosinophils Absolute: 0.2 10*3/uL (ref 0.0–0.5)
Eosinophils Relative: 7 %
HCT: 30.7 % — ABNORMAL LOW (ref 36.0–46.0)
Hemoglobin: 9.9 g/dL — ABNORMAL LOW (ref 12.0–15.0)
Immature Granulocytes: 1 %
Lymphocytes Relative: 37 %
Lymphs Abs: 1.2 10*3/uL (ref 0.7–4.0)
MCH: 28.1 pg (ref 26.0–34.0)
MCHC: 32.2 g/dL (ref 30.0–36.0)
MCV: 87.2 fL (ref 80.0–100.0)
Monocytes Absolute: 0.4 10*3/uL (ref 0.1–1.0)
Monocytes Relative: 11 %
Neutro Abs: 1.3 10*3/uL — ABNORMAL LOW (ref 1.7–7.7)
Neutrophils Relative %: 43 %
Platelets: 262 10*3/uL (ref 150–400)
RBC: 3.52 MIL/uL — ABNORMAL LOW (ref 3.87–5.11)
RDW: 14.1 % (ref 11.5–15.5)
WBC: 3.2 10*3/uL — ABNORMAL LOW (ref 4.0–10.5)
nRBC: 0 % (ref 0.0–0.2)

## 2019-04-17 MED ORDER — SUCRALFATE 1 GM/10ML PO SUSP
1.0000 g | Freq: Four times a day (QID) | ORAL | Status: DC
  Administered 2019-04-17 – 2019-04-19 (×8): 1 g via ORAL
  Filled 2019-04-17 (×8): qty 10

## 2019-04-17 MED ORDER — OXYCODONE HCL 5 MG PO TABS
5.0000 mg | ORAL_TABLET | Freq: Four times a day (QID) | ORAL | Status: DC | PRN
  Administered 2019-04-17 – 2019-04-19 (×7): 5 mg via ORAL
  Filled 2019-04-17 (×8): qty 1

## 2019-04-17 NOTE — Progress Notes (Signed)
Ref: Dixie Dials, MD   Subjective:  Abdominal pain epigastric area continues. No chest pain. VS stable. Hgb down to 9.9 g. From 11.3 g.  Objective:  Vital Signs in the last 24 hours: Temp:  [98.2 F (36.8 C)-99.1 F (37.3 C)] 98.2 F (36.8 C) (09/22 0906) Pulse Rate:  [72-86] 79 (09/22 0906) Cardiac Rhythm: Normal sinus rhythm (09/22 0700) Resp:  [16-18] 18 (09/22 0906) BP: (112-126)/(50-66) 112/50 (09/22 0906) SpO2:  [98 %-99 %] 98 % (09/22 0906) Weight:  [63.2 kg] 63.2 kg (09/22 0447)  Physical Exam: BP Readings from Last 1 Encounters:  04/17/19 (!) 112/50     Wt Readings from Last 1 Encounters:  04/17/19 63.2 kg    Weight change: -0.304 kg Body mass index is 21.82 kg/m. HEENT: [place]/AT, Eyes-[place]azel, PERL, EOMI, Conjunctiva-Pale pink, Sclera-Non-icteric Neck: No JVD, No bruit, Trachea midline. Lungs:  Clear, Bilateral. Cardiac:  Regular rhythm, normal S1 and S2, no S3. II/VI systolic murmur. Abdomen:  Soft, non-tender. BS present. Extremities:  No edema present. No cyanosis. No clubbing. CNS: AxOx3, Cranial nerves grossly intact, moves all 4 extremities.  Skin: Warm and dry.   Intake/Output from previous day: 09/21 0701 - 09/22 0700 In: 520 [P.O.:120; I.V.:400] Out: 0     Lab Results: BMET    Component Value Date/Time   NA 138 04/16/2019 1041   NA 137 04/15/2019 1852   NA 137 01/24/2018 0344   K 3.8 04/16/2019 1041   K 3.5 04/15/2019 1852   K 3.7 01/24/2018 0344   CL 109 04/16/2019 1041   CL 101 04/15/2019 1852   CL 106 01/24/2018 0344   CO2 24 04/16/2019 1041   CO2 24 04/15/2019 1852   CO2 22 01/24/2018 0344   GLUCOSE 102 (H) 04/16/2019 1041   GLUCOSE 120 (H) 04/15/2019 1852   GLUCOSE 112 (H) 01/24/2018 0344   BUN 6 04/16/2019 1041   BUN 17 04/15/2019 1852   BUN 13 01/24/2018 0344   CREATININE 0.42 (L) 04/16/2019 1041   CREATININE 0.62 04/15/2019 1852   CREATININE 0.50 01/24/2018 0344   CALCIUM 8.3 (L) 04/16/2019 1041   CALCIUM 8.9 04/15/2019  1852   CALCIUM 8.7 (L) 01/24/2018 0344   GFRNONAA >60 04/16/2019 1041   GFRNONAA >60 04/15/2019 1852   GFRNONAA >60 01/24/2018 0344   GFRAA >60 04/16/2019 1041   GFRAA >60 04/15/2019 1852   GFRAA >60 01/24/2018 0344   CBC    Component Value Date/Time   WBC 3.2 (L) 04/17/2019 0619   RBC 3.52 (L) 04/17/2019 0619   HGB 9.9 (L) 04/17/2019 0619   HCT 30.7 (L) 04/17/2019 0619   PLT 262 04/17/2019 0619   MCV 87.2 04/17/2019 0619   MCH 28.1 04/17/2019 0619   MCHC 32.2 04/17/2019 0619   RDW 14.1 04/17/2019 0619   LYMPHSABS 1.2 04/17/2019 0619   MONOABS 0.4 04/17/2019 0619   EOSABS 0.2 04/17/2019 0619   BASOSABS 0.0 04/17/2019 0619   HEPATIC Function Panel Recent Labs    04/15/19 1852  PROT 7.6   HEMOGLOBIN A1C No components found for: HGA1C,  MPG CARDIAC ENZYMES Lab Results  Component Value Date   TROPONINI <0.30 09/19/2012   BNP No results for input(s): PROBNP in the last 8760 hours. TSH No results for input(s): TSH in the last 8760 hours. CHOLESTEROL No results for input(s): CHOL in the last 8760 hours.  Scheduled Meds: . DULoxetine  60 mg Oral BID  . Oxcarbazepine  300 mg Oral BID  . pantoprazole (PROTONIX) IV  40 mg Intravenous Q12H  . sucralfate  1 g Oral Q6H  . thyroid  60 mg Oral QAC breakfast   Continuous Infusions: PRN Meds:.ondansetron (ZOFRAN) IV, oxyCODONE, SUMAtriptan  Assessment/Plan: Acute upper GI bleed Non bleeding anastomotic gastric ulcer S/P Billroth I gastric bypass. Mild gastritis Anemia of acute blood loss Aplastic anemia Seizure disorder Hypothyroidism H/O migraine headaches Chronic pain and fibromyalgia Depression  Add Carafate slurry. Continue pantoprazole bid.  Increase activity Home soon. Discussed ways to relieve stress   LOS: 2 days   Time spent including chart review, lab review, examination, discussion with patient : 35 min   Dixie Dials  MD  04/17/2019, 6:02 PM

## 2019-04-17 NOTE — Progress Notes (Signed)
Patient is requesting Carafate. Doylene Canard, MD gave verbal order for Carafate Liquid 1g every 6 hours PO. Orders placed and followed.

## 2019-04-18 ENCOUNTER — Encounter (HOSPITAL_COMMUNITY): Payer: Self-pay | Admitting: Gastroenterology

## 2019-04-18 LAB — CBC WITH DIFFERENTIAL/PLATELET
Abs Immature Granulocytes: 0 10*3/uL (ref 0.00–0.07)
Basophils Absolute: 0 10*3/uL (ref 0.0–0.1)
Basophils Relative: 1 %
Eosinophils Absolute: 0.3 10*3/uL (ref 0.0–0.5)
Eosinophils Relative: 8 %
HCT: 33.6 % — ABNORMAL LOW (ref 36.0–46.0)
Hemoglobin: 11.1 g/dL — ABNORMAL LOW (ref 12.0–15.0)
Immature Granulocytes: 0 %
Lymphocytes Relative: 31 %
Lymphs Abs: 1.1 10*3/uL (ref 0.7–4.0)
MCH: 28.5 pg (ref 26.0–34.0)
MCHC: 33 g/dL (ref 30.0–36.0)
MCV: 86.2 fL (ref 80.0–100.0)
Monocytes Absolute: 0.4 10*3/uL (ref 0.1–1.0)
Monocytes Relative: 10 %
Neutro Abs: 1.8 10*3/uL (ref 1.7–7.7)
Neutrophils Relative %: 50 %
Platelets: 295 10*3/uL (ref 150–400)
RBC: 3.9 MIL/uL (ref 3.87–5.11)
RDW: 14.1 % (ref 11.5–15.5)
WBC: 3.6 10*3/uL — ABNORMAL LOW (ref 4.0–10.5)
nRBC: 0 % (ref 0.0–0.2)

## 2019-04-18 LAB — SURGICAL PATHOLOGY

## 2019-04-18 MED ORDER — HYDROMORPHONE HCL 1 MG/ML IJ SOLN
1.0000 mg | INTRAMUSCULAR | Status: DC | PRN
  Administered 2019-04-18 – 2019-04-19 (×5): 1 mg via INTRAVENOUS
  Filled 2019-04-18 (×5): qty 1

## 2019-04-18 MED ORDER — PANTOPRAZOLE SODIUM 40 MG PO TBEC: 40.0000 mg | DELAYED_RELEASE_TABLET | Freq: Two times a day (BID) | ORAL | 6 refills | Status: AC

## 2019-04-18 MED ORDER — SUCRALFATE 1 GM/10ML PO SUSP: 1.0000 g | Freq: Four times a day (QID) | ORAL | 3 refills | Status: AC

## 2019-04-18 MED ORDER — BUSPIRONE HCL 5 MG PO TABS: 15.0000 mg | ORAL_TABLET | Freq: Every day | ORAL | Status: DC | PRN

## 2019-04-18 MED ORDER — SODIUM CHLORIDE 0.9 % IV SOLN
INTRAVENOUS | Status: DC
  Administered 2019-04-18 – 2019-04-19 (×2): via INTRAVENOUS

## 2019-04-18 MED ORDER — BUSPIRONE HCL 15 MG PO TABS: 15.0000 mg | ORAL_TABLET | Freq: Every day | ORAL | 3 refills | Status: AC | PRN

## 2019-04-18 NOTE — Progress Notes (Signed)
When I came in patients room to discharge her, I found her hands and knees on the bed in pain. Patient states that her pain is a 10/10 in the center of her abdomen and is radiating to her back. Doylene Canard, MD notifed and stated that he would be up at the bedside to see her and gave verbal order to start NS at 75 ml/hr. Orders followed and MD at bedside. Will continue to monitor.

## 2019-04-18 NOTE — Progress Notes (Deleted)
DISCHARGE NOTE HOME Alexis Singleton to be discharged Home per MD order. Discussed prescriptions and follow up appointments with the patient. Prescriptions given to patient; medication list explained in detail. Patient verbalized understanding.  Skin clean, dry and intact without evidence of skin break down, no evidence of skin tears noted. IV catheter discontinued intact. Site without signs and symptoms of complications. Dressing and pressure applied. Pt denies pain at the site currently. No complaints noted.  Patient free of lines, drains, and wounds.   An After Visit Summary (AVS) was printed and given to the patient. Patient escorted via wheelchair, and discharged home via private auto.  Aneta Mins BSN, RN3

## 2019-04-18 NOTE — Progress Notes (Signed)
Abdominal pain severe again. Will hold discharge. IV dilaudid and IV fluid  Dixie Dials, MD. 6:14 PM, 04/18/2019.

## 2019-04-18 NOTE — Plan of Care (Signed)
  Problem: Education: Goal: Knowledge of General Education information will improve Description Including pain rating scale, medication(s)/side effects and non-pharmacologic comfort measures Outcome: Progressing   

## 2019-04-18 NOTE — Discharge Summary (Signed)
Physician Discharge Summary  Patient ID: Alexis Singleton MRN: [id number] DOB/AGE: 08-01-1980 38 y.o.  Admit date: 04/15/2019 Discharge date: 04/18/2019  Admission Diagnoses: Acute upper GI bleed H/O duodenal ulcer S/P gastric bypass Anemia of acute blood loss H/o aplastic anemia Seizure disorder Hypothyroidism H/O migraine headaches Chronic pain and fibromyalgia Depression  Discharge Diagnoses:  Principle problem: Acute upper GI bleed Active Problems:   Non bleeding anastomotic gastric ulcer   S/P Billroth I gastric bypass   Mild gastritis   Anemia of acute blood loss   Aplastic anemia   Seizure disorder   Hypothyroidism   H/O migraine headaches   Chronic pain syndrome   Fibromyalgia   Depression  Discharged Condition: fair  Hospital Course: 38 years old white female with PMH of gastric bypass, duodenal ulcer, Aplastic anemia, fibromyalgia, chronic pain syndrome, Breast cancer, thyroid cancer and uterine cancer had several episodes of nausea, vomiting and epigastric pain with dark stools and blood in vomitus. She denied NSAID or aspirin use but used coffee, Coca-cola and tylenol PM. She had GI consult. EGD showed non-bleeding anastomotic ulcer of Billroth I gastric bypass and mild gastritis. She had Protonix dose made bid and Carafate slurry was added with good relief. Buspar was added for anxiety. She was discharged home in stable condition with follow up by me in 1 week and by GI doctor in 4 weeks.  Consults: GI  Significant Diagnostic Studies: labs: Hgb 9.4 to 11.5 g. BMET was near normal with borderline blood sugar level. Lipase was normal. Stool was positive for occult blood.  Upper endoscopy: Non bleeding anastomotic gastric ulcer, S/P billroth I gastric bypass and mild gastritis.  Treatments: procedures: Upper endoscopy, IV followed by PO protonix and Carafate slurry.  Discharge Exam: Blood pressure 134/61, pulse 74, temperature 98.1 F (36.7 C), temperature  source Oral, resp. rate 18, height _0  (1.702 m), weight 63.2 kg, SpO2 98 %. General appearance: alert, cooperative and appears stated age. Head: Normocephalic, atraumatic. Eyes: Hazel eyes, pale pink conjunctiva, corneas clear. PERRL, EOM's intact.  Neck: No adenopathy, no carotid bruit, no JVD, supple, symmetrical, trachea midline and thyroid not enlarged. Resp: Clear to auscultation bilaterally. Cardio: Regular rate and rhythm, S1, S2 normal, II/VI systolic murmur, no click, rub or gallop. GI: Soft, epigastric area-tender; bowel sounds normal; no organomegaly. Extremities: No edema, cyanosis or clubbing. Skin: Warm and dry.  Neurologic: Alert and oriented X 3, normal strength and tone. Normal coordination and gait.  Disposition: Discharge disposition: 01-Home or Self Care        Allergies as of 04/18/2019      Reactions   Ioxaglate Anaphylaxis   Ivp Dye [iodinated Diagnostic Agents] Anaphylaxis   Metrizamide Anaphylaxis   Other Anaphylaxis   Seafood   Tramadol Other (See Comments)   Seizures   Carbamazepine Other (See Comments)   Headache   Buprenorphine Nausea Only   Divalproex Sodium Other (See Comments)   Increased seizure activity   Levetiracetam Other (See Comments)   Increased seizure activity   Nsaids Other (See Comments)   Gastric bypass patient   Morphine Itching   Topiramate Nausea Only   Valproic Acid Nausea Only      Medication List    STOP taking these medications   sucralfate 1 g tablet Commonly known as: CARAFATE Replaced by: sucralfate 1 GM/10ML suspension     TAKE these medications   busPIRone 15 MG tablet Commonly known as: BUSPAR Take 1 tablet (15 mg total) by mouth daily as needed (Anxiety).  Cyanocobalamin 1000 MCG/ML Kit Inject 1,000 mg as directed every 30 (thirty) days.   DULoxetine 60 MG capsule Commonly known as: CYMBALTA Take 1 capsule (60 mg total) by mouth 2 (two) times daily.   ferrous sulfate 325 (65 FE) MG  tablet Take 1 tablet (325 mg total) by mouth 2 (two) times daily with a meal.   multivitamin capsule Take 1 capsule by mouth daily.   ondansetron 4 MG disintegrating tablet Commonly known as: ZOFRAN-ODT Take 4 mg by mouth every 8 (eight) hours as needed for nausea or vomiting.   Oxcarbazepine 300 MG tablet Commonly known as: TRILEPTAL Take 1 tablet (300 mg total) by mouth 2 (two) times daily.   pantoprazole 40 MG tablet Commonly known as: PROTONIX Take 1 tablet (40 mg total) by mouth 2 (two) times daily. What changed: when to take this   pentosan polysulfate 100 MG capsule Commonly known as: ELMIRON Place 100 mg into the urethra as needed (for bladder).   solifenacin 10 MG tablet Commonly known as: VESICARE Take 10 mg by mouth daily as needed (bladder).   sucralfate 1 GM/10ML suspension Commonly known as: CARAFATE Take 10 mLs (1 g total) by mouth every 6 (six) hours. Replaces: sucralfate 1 g tablet   SUMAtriptan 50 MG tablet Commonly known as: IMITREX Take 1 tablet (50 mg total) by mouth every 2 (two) hours as needed for migraine or headache. May repeat in 2 hours if headache persists or recurs.   thyroid 60 MG tablet Commonly known as: ARMOUR Take 60 mg by mouth daily before breakfast.   Uribel 118 MG Caps Take 118 mg by mouth as needed (for bladder).      Follow-up Information    Brahmbhatt, Parag, MD. Schedule an appointment as soon as possible for a visit in 4 week(s).   Specialty: Gastroenterology Why: Anastomotic ulcer, family history of colon cancer Contact information: [place] [address] [phone]        Dixie Dials, MD. Schedule an appointment as soon as possible for a visit in 1 week(s).   Specialty: Cardiology Contact information: [place] [address] [phone]           Time spent: Review of old chart, current chart, lab, x-Alexis, cardiac tests and discussion with patient over 60  minutes.  Signed: Birdie Riddle 04/18/2019, 2:12 PM

## 2019-04-19 NOTE — Discharge Summary (Signed)
Physician Discharge Summary  Patient ID: Alexis Singleton MRN: [id number] DOB/AGE: 1981/02/08 38 y.o.  Admit date: 04/15/2019 Discharge date: 04/19/2019  Admission Diagnoses: Acute upper GI bleed H/O duodenal ulcer S/P gastric bypass Anemia of acute blood loss H/o aplastic anemia Seizure disorder Hypothyroidism H/O migraine headaches Chronic pain and fibromyalgia Depression  Discharge Diagnoses:  Principle problem: Acute upper GI bleed Active Problems:   Non bleeding anastomotic gastric ulcer   S/P Billroth I gastric bypass   Mild gastritis   Anemia of acute blood loss   Aplastic anemia   Seizure disorder   Hypothyroidism   H/O migraine headaches   Chronic pain syndrome   Fibromyalgia   Depression  Discharged Condition: fair  Hospital Course: 38 years old white female with PMH of gastric bypass, duodenal ulcer, Aplastic anemia, fibromyalgia, chronic pain syndrome, Breast cancer, thyroid cancer and uterine cancer had several episodes of nausea, vomiting and epigastric pain with dark stools and blood in vomitus. She denied NSAID or aspirin use but used coffee, Coca-cola and tylenol PM. She had GI consult. EGD showed non-bleeding anastomotic ulcer of Billroth I gastric bypass and mild gastritis. She had Protonix dose made bid and Carafate slurry was added with good relief. Buspar was added for anxiety. She was discharged home in stable condition with follow up by me in 1 week and by GI doctor in 4 weeks.  Consults: GI  Significant Diagnostic Studies: labs: Hgb 9.4 to 11.5 g. BMET was near normal with borderline blood sugar level. Lipase was normal. Stool was positive for occult blood.  Upper endoscopy: Non bleeding anastomotic gastric ulcer, S/P billroth I gastric bypass and mild gastritis.  Treatments: procedures: Upper endoscopy, IV followed by PO protonix and Carafate slurry.  Discharge Exam: Blood pressure 134/61, pulse 74, temperature 98.1 F (36.7 C),  temperature source Oral, resp. rate 18, height 5' 7"  (1.702 m), weight 63.2 kg, SpO2 98 %. General appearance: alert, cooperative and appears stated age. Head: Normocephalic, atraumatic. Eyes: Hazel eyes, pale pink conjunctiva, corneas clear. PERRL, EOM's intact.  Neck: No adenopathy, no carotid bruit, no JVD, supple, symmetrical, trachea midline and thyroid not enlarged. Resp: Clear to auscultation bilaterally. Cardio: Regular rate and rhythm, S1, S2 normal, II/VI systolic murmur, no click, rub or gallop. GI: Soft, epigastric area-tender; bowel sounds normal; no organomegaly. Extremities: No edema, cyanosis or clubbing. Skin: Warm and dry.  Neurologic: Alert and oriented X 3, normal strength and tone. Normal coordination and gait.  Disposition: Discharge disposition: 01-Home or Self Care             Allergies as of 04/18/2019      Reactions   Ioxaglate Anaphylaxis   Ivp Dye [iodinated Diagnostic Agents] Anaphylaxis   Metrizamide Anaphylaxis   Other Anaphylaxis   Seafood   Tramadol Other (See Comments)   Seizures   Carbamazepine Other (See Comments)   Headache   Buprenorphine Nausea Only   Divalproex Sodium Other (See Comments)   Increased seizure activity   Levetiracetam Other (See Comments)   Increased seizure activity   Nsaids Other (See Comments)   Gastric bypass patient   Morphine Itching   Topiramate Nausea Only   Valproic Acid Nausea Only         Medication List    STOP taking these medications   sucralfate 1 g tablet Commonly known as: CARAFATE Replaced by: sucralfate 1 GM/10ML suspension     TAKE these medications   busPIRone 15 MG tablet Commonly known as: BUSPAR Take 1 tablet (15  mg total) by mouth daily as needed (Anxiety).   Cyanocobalamin 1000 MCG/ML Kit Inject 1,000 mg as directed every 30 (thirty) days.   DULoxetine 60 MG capsule Commonly known as: CYMBALTA Take 1 capsule (60 mg total) by mouth 2 (two)  times daily.   ferrous sulfate 325 (65 FE) MG tablet Take 1 tablet (325 mg total) by mouth 2 (two) times daily with a meal.   multivitamin capsule Take 1 capsule by mouth daily.   ondansetron 4 MG disintegrating tablet Commonly known as: ZOFRAN-ODT Take 4 mg by mouth every 8 (eight) hours as needed for nausea or vomiting.   Oxcarbazepine 300 MG tablet Commonly known as: TRILEPTAL Take 1 tablet (300 mg total) by mouth 2 (two) times daily.   pantoprazole 40 MG tablet Commonly known as: PROTONIX Take 1 tablet (40 mg total) by mouth 2 (two) times daily. What changed: when to take this   pentosan polysulfate 100 MG capsule Commonly known as: ELMIRON Place 100 mg into the urethra as needed (for bladder).   solifenacin 10 MG tablet Commonly known as: VESICARE Take 10 mg by mouth daily as needed (bladder).   sucralfate 1 GM/10ML suspension Commonly known as: CARAFATE Take 10 mLs (1 g total) by mouth every 6 (six) hours. Replaces: sucralfate 1 g tablet   SUMAtriptan 50 MG tablet Commonly known as: IMITREX Take 1 tablet (50 mg total) by mouth every 2 (two) hours as needed for migraine or headache. May repeat in 2 hours if headache persists or recurs.   thyroid 60 MG tablet Commonly known as: ARMOUR Take 60 mg by mouth daily before breakfast.   Uribel 118 MG Caps Take 118 mg by mouth as needed (for bladder).         Follow-up Information    Brahmbhatt, Parag, MD. Schedule an appointment as soon as possible for a visit in 4 week(s).   Specialty: Gastroenterology Why: Anastomotic ulcer, family history of colon cancer Contact information: [place] [address] [phone]        Dixie Dials, MD. Schedule an appointment as soon as possible for a visit in 1 week(s).   Specialty: Cardiology Contact information: [place] [address] [phone]           Time spent: Review of old chart,  current chart, lab, x-ray, cardiac tests and discussion with patient over 60 minutes.  Signed: Birdie Riddle 04/18/2019, 2:12 PM          Routing History

## 2019-04-19 NOTE — Progress Notes (Signed)
Alexis Singleton to be discharged Home per MD order. Discussed prescriptions and follow up appointments with the patient. Prescriptions given to patient; medication list explained in detail. Patient verbalized understanding.  Skin clean, dry and intact without evidence of skin break down, no evidence of skin tears noted. IV catheter discontinued intact. Site without signs and symptoms of complications. Dressing and pressure applied. Pt denies pain at the site currently. No complaints noted.  Patient free of lines, drains, and wounds.   An After Visit Summary (AVS) was printed and given to the patient. Patient escorted via wheelchair, and discharged home via private auto.  Shela Commons, RN

## 2019-07-12 DIAGNOSIS — E039 Hypothyroidism, unspecified: Secondary | ICD-10-CM | POA: Diagnosis not present

## 2019-07-12 DIAGNOSIS — E559 Vitamin D deficiency, unspecified: Secondary | ICD-10-CM | POA: Diagnosis not present

## 2019-07-12 DIAGNOSIS — E538 Deficiency of other specified B group vitamins: Secondary | ICD-10-CM | POA: Diagnosis not present

## 2019-08-30 DIAGNOSIS — R1013 Epigastric pain: Secondary | ICD-10-CM | POA: Diagnosis not present

## 2019-08-30 DIAGNOSIS — R072 Precordial pain: Secondary | ICD-10-CM | POA: Diagnosis not present

## 2019-08-30 DIAGNOSIS — G43009 Migraine without aura, not intractable, without status migrainosus: Secondary | ICD-10-CM | POA: Diagnosis not present

## 2019-08-30 DIAGNOSIS — M25511 Pain in right shoulder: Secondary | ICD-10-CM | POA: Diagnosis not present

## 2019-09-07 DIAGNOSIS — Z79899 Other long term (current) drug therapy: Secondary | ICD-10-CM | POA: Diagnosis not present

## 2019-09-07 DIAGNOSIS — M797 Fibromyalgia: Secondary | ICD-10-CM | POA: Diagnosis not present

## 2019-11-06 DIAGNOSIS — Z9884 Bariatric surgery status: Secondary | ICD-10-CM | POA: Diagnosis not present

## 2019-11-06 DIAGNOSIS — Z87410 Personal history of cervical dysplasia: Secondary | ICD-10-CM | POA: Diagnosis not present

## 2019-11-06 DIAGNOSIS — Z9882 Breast implant status: Secondary | ICD-10-CM | POA: Diagnosis not present

## 2019-11-06 DIAGNOSIS — N644 Mastodynia: Secondary | ICD-10-CM | POA: Diagnosis not present

## 2019-11-06 DIAGNOSIS — Z9889 Other specified postprocedural states: Secondary | ICD-10-CM | POA: Diagnosis not present

## 2019-11-06 DIAGNOSIS — N958 Other specified menopausal and perimenopausal disorders: Secondary | ICD-10-CM | POA: Diagnosis not present

## 2019-12-04 DIAGNOSIS — Z124 Encounter for screening for malignant neoplasm of cervix: Secondary | ICD-10-CM | POA: Diagnosis not present

## 2019-12-04 DIAGNOSIS — Z01419 Encounter for gynecological examination (general) (routine) without abnormal findings: Secondary | ICD-10-CM | POA: Diagnosis not present

## 2019-12-04 DIAGNOSIS — Z8619 Personal history of other infectious and parasitic diseases: Secondary | ICD-10-CM | POA: Diagnosis not present

## 2019-12-04 DIAGNOSIS — Z90710 Acquired absence of both cervix and uterus: Secondary | ICD-10-CM | POA: Diagnosis not present

## 2019-12-04 DIAGNOSIS — Z0000 Encounter for general adult medical examination without abnormal findings: Secondary | ICD-10-CM | POA: Diagnosis not present

## 2019-12-04 DIAGNOSIS — N951 Menopausal and female climacteric states: Secondary | ICD-10-CM | POA: Diagnosis not present

## 2019-12-04 DIAGNOSIS — N958 Other specified menopausal and perimenopausal disorders: Secondary | ICD-10-CM | POA: Diagnosis not present

## 2019-12-04 DIAGNOSIS — Z1151 Encounter for screening for human papillomavirus (HPV): Secondary | ICD-10-CM | POA: Diagnosis not present

## 2019-12-04 DIAGNOSIS — Z87410 Personal history of cervical dysplasia: Secondary | ICD-10-CM | POA: Diagnosis not present

## 2019-12-04 DIAGNOSIS — N644 Mastodynia: Secondary | ICD-10-CM | POA: Diagnosis not present

## 2019-12-04 DIAGNOSIS — Z9882 Breast implant status: Secondary | ICD-10-CM | POA: Diagnosis not present

## 2020-01-01 DIAGNOSIS — D508 Other iron deficiency anemias: Secondary | ICD-10-CM | POA: Diagnosis not present

## 2020-01-01 DIAGNOSIS — Z9884 Bariatric surgery status: Secondary | ICD-10-CM | POA: Diagnosis not present

## 2020-01-01 DIAGNOSIS — E538 Deficiency of other specified B group vitamins: Secondary | ICD-10-CM | POA: Diagnosis not present

## 2020-01-01 DIAGNOSIS — K909 Intestinal malabsorption, unspecified: Secondary | ICD-10-CM | POA: Diagnosis not present

## 2020-01-10 DIAGNOSIS — E039 Hypothyroidism, unspecified: Secondary | ICD-10-CM | POA: Diagnosis not present

## 2020-01-10 DIAGNOSIS — E559 Vitamin D deficiency, unspecified: Secondary | ICD-10-CM | POA: Diagnosis not present

## 2020-01-10 DIAGNOSIS — E538 Deficiency of other specified B group vitamins: Secondary | ICD-10-CM | POA: Diagnosis not present

## 2020-01-14 DIAGNOSIS — N644 Mastodynia: Secondary | ICD-10-CM | POA: Diagnosis not present

## 2020-01-14 DIAGNOSIS — R922 Inconclusive mammogram: Secondary | ICD-10-CM | POA: Diagnosis not present

## 2020-05-08 DIAGNOSIS — E538 Deficiency of other specified B group vitamins: Secondary | ICD-10-CM | POA: Diagnosis not present

## 2020-05-08 DIAGNOSIS — Z9884 Bariatric surgery status: Secondary | ICD-10-CM | POA: Diagnosis not present

## 2020-05-08 DIAGNOSIS — D508 Other iron deficiency anemias: Secondary | ICD-10-CM | POA: Diagnosis not present

## 2020-05-08 DIAGNOSIS — D509 Iron deficiency anemia, unspecified: Secondary | ICD-10-CM | POA: Diagnosis not present

## 2020-05-08 DIAGNOSIS — K909 Intestinal malabsorption, unspecified: Secondary | ICD-10-CM | POA: Diagnosis not present

## 2020-05-09 DIAGNOSIS — Z23 Encounter for immunization: Secondary | ICD-10-CM | POA: Diagnosis not present

## 2020-05-10 ENCOUNTER — Encounter (HOSPITAL_BASED_OUTPATIENT_CLINIC_OR_DEPARTMENT_OTHER): Payer: Self-pay | Admitting: *Deleted

## 2020-05-10 ENCOUNTER — Emergency Department (HOSPITAL_BASED_OUTPATIENT_CLINIC_OR_DEPARTMENT_OTHER): Payer: Medicaid Other

## 2020-05-10 ENCOUNTER — Emergency Department (HOSPITAL_BASED_OUTPATIENT_CLINIC_OR_DEPARTMENT_OTHER)
Admission: EM | Admit: 2020-05-10 | Discharge: 2020-05-11 | Disposition: A | Discharge status: Home / Self Care | Payer: Medicaid Other | Attending: Emergency Medicine | Admitting: Emergency Medicine

## 2020-05-10 ENCOUNTER — Other Ambulatory Visit: Payer: Self-pay

## 2020-05-10 DIAGNOSIS — Z8542 Personal history of malignant neoplasm of other parts of uterus: Secondary | ICD-10-CM | POA: Diagnosis not present

## 2020-05-10 DIAGNOSIS — R079 Chest pain, unspecified: Secondary | ICD-10-CM | POA: Insufficient documentation

## 2020-05-10 DIAGNOSIS — R21 Rash and other nonspecific skin eruption: Secondary | ICD-10-CM | POA: Diagnosis not present

## 2020-05-10 DIAGNOSIS — Z9889 Other specified postprocedural states: Secondary | ICD-10-CM | POA: Diagnosis not present

## 2020-05-10 DIAGNOSIS — Z9049 Acquired absence of other specified parts of digestive tract: Secondary | ICD-10-CM | POA: Diagnosis not present

## 2020-05-10 DIAGNOSIS — Z853 Personal history of malignant neoplasm of breast: Secondary | ICD-10-CM | POA: Insufficient documentation

## 2020-05-10 DIAGNOSIS — R0789 Other chest pain: Secondary | ICD-10-CM | POA: Diagnosis not present

## 2020-05-10 DIAGNOSIS — Z8543 Personal history of malignant neoplasm of ovary: Secondary | ICD-10-CM | POA: Insufficient documentation

## 2020-05-10 DIAGNOSIS — R0602 Shortness of breath: Secondary | ICD-10-CM | POA: Diagnosis not present

## 2020-05-10 DIAGNOSIS — Z9882 Breast implant status: Secondary | ICD-10-CM | POA: Diagnosis not present

## 2020-05-10 DIAGNOSIS — Z0389 Encounter for observation for other suspected diseases and conditions ruled out: Secondary | ICD-10-CM | POA: Diagnosis not present

## 2020-05-10 DIAGNOSIS — Z85850 Personal history of malignant neoplasm of thyroid: Secondary | ICD-10-CM | POA: Diagnosis not present

## 2020-05-10 HISTORY — DX: Iron deficiency anemia, unspecified: D50.9

## 2020-05-10 HISTORY — DX: Other primary thrombophilia: D68.59

## 2020-05-10 HISTORY — DX: Activated protein C resistance: D68.51

## 2020-05-10 LAB — BASIC METABOLIC PANEL
Anion gap: 8 (ref 5–15)
BUN: 13 mg/dL (ref 6–20)
CO2: 23 mmol/L (ref 22–32)
Calcium: 8.7 mg/dL — ABNORMAL LOW (ref 8.9–10.3)
Chloride: 104 mmol/L (ref 98–111)
Creatinine, Ser: 0.47 mg/dL (ref 0.44–1.00)
GFR, Estimated: >60 mL/min (ref >60)
Glucose, Bld: 105 mg/dL — ABNORMAL HIGH (ref 70–99)
Potassium: 4.2 mmol/L (ref 3.5–5.1)
Sodium: 135 mmol/L (ref 135–145)

## 2020-05-10 LAB — CBC WITH DIFFERENTIAL/PLATELET
Abs Immature Granulocytes: 0.04 10*3/uL (ref 0.00–0.07)
Basophils Absolute: 0 10*3/uL (ref 0.0–0.1)
Basophils Relative: 0 %
Eosinophils Absolute: 0.3 10*3/uL (ref 0.0–0.5)
Eosinophils Relative: 6 %
HCT: 35 % — ABNORMAL LOW (ref 36.0–46.0)
Hemoglobin: 11.2 g/dL — ABNORMAL LOW (ref 12.0–15.0)
Immature Granulocytes: 1 %
Lymphocytes Relative: 17 %
Lymphs Abs: 0.9 10*3/uL (ref 0.7–4.0)
MCH: 26 pg (ref 26.0–34.0)
MCHC: 32 g/dL (ref 30.0–36.0)
MCV: 81.2 fL (ref 80.0–100.0)
Monocytes Absolute: 0.4 10*3/uL (ref 0.1–1.0)
Monocytes Relative: 7 %
Neutro Abs: 3.4 10*3/uL (ref 1.7–7.7)
Neutrophils Relative %: 69 %
Platelets: 356 10*3/uL (ref 150–400)
RBC: 4.31 MIL/uL (ref 3.87–5.11)
RDW: 14.8 % (ref 11.5–15.5)
WBC: 5 10*3/uL (ref 4.0–10.5)
nRBC: 0 % (ref 0.0–0.2)

## 2020-05-10 LAB — BRAIN NATRIURETIC PEPTIDE: B Natriuretic Peptide: 72.7 pg/mL (ref 0.0–100.0)

## 2020-05-10 LAB — TROPONIN I (HIGH SENSITIVITY): Troponin I (High Sensitivity): <2 ng/L (ref <18)

## 2020-05-10 MED ORDER — DIPHENHYDRAMINE HCL 25 MG PO CAPS
50.0000 mg | ORAL_CAPSULE | Freq: Once | ORAL | Status: AC
  Administered 2020-05-11: 50 mg via ORAL
  Filled 2020-05-10: qty 2

## 2020-05-10 MED ORDER — DIPHENHYDRAMINE HCL 50 MG/ML IJ SOLN
50.0000 mg | Freq: Once | INTRAMUSCULAR | Status: AC
  Filled 2020-05-10: qty 1

## 2020-05-10 MED ORDER — HYDROCORTISONE NA SUCCINATE PF 250 MG IJ SOLR
200.0000 mg | Freq: Once | INTRAMUSCULAR | Status: DC
  Filled 2020-05-10: qty 200

## 2020-05-10 MED ORDER — HYDROCORTISONE NA SUCCINATE PF 100 MG IJ SOLR
INTRAMUSCULAR | Status: AC
  Administered 2020-05-10: 100 mg
  Filled 2020-05-10: qty 2

## 2020-05-10 NOTE — ED Triage Notes (Signed)
Pt has a hx of iron deficient anemia. States she has been having centralized chest pain for a week that varies in intensity. C/o felling SOB also. She had her covid booster shot yesterday

## 2020-05-11 ENCOUNTER — Emergency Department (HOSPITAL_BASED_OUTPATIENT_CLINIC_OR_DEPARTMENT_OTHER): Payer: Medicaid Other

## 2020-05-11 ENCOUNTER — Encounter (HOSPITAL_BASED_OUTPATIENT_CLINIC_OR_DEPARTMENT_OTHER): Payer: Self-pay

## 2020-05-11 MED ORDER — IOHEXOL 350 MG/ML SOLN
100.0000 mL | Freq: Once | INTRAVENOUS | Status: AC | PRN
  Administered 2020-05-11: 100 mL via INTRAVENOUS

## 2020-05-11 NOTE — ED Provider Notes (Signed)
[facility] EMERGENCY DEPARTMENT Provider Note   CSN: [id number] Arrival date & time: 05/10/20  1954     History Chief Complaint  Patient presents with  . Chest Pain    Alexis Singleton is a 39 y.o. female.  39 yo F with a chief complaints of chest pain.  This is left-sided and feels like a pressure and radiates into the left arm.  This usually is worse with exertion and better with rest.  Off and on for the past couple weeks.  She has been seen by her hematologist and has iron deficiency anemia.  She is supposed to start iron infusions next week.  She thought her symptoms may be due to that and was waiting for the iron infusion though she felt her symptoms had worsened over the past 24 to 48 hours and came to the ED for evaluation.  She denies cough congestion or fever.  Denies history of MI denies hypertension hyperlipidemia diabetes smoking or family history of MI.  She does has a history of PE.  Has factor V Leiden.  Not on anticoagulation.  The history is provided by the patient.  Chest Pain Pain location:  L chest Pain quality: aching   Pain radiates to:  L shoulder Pain severity:  Moderate Onset quality:  Gradual Duration:  2 weeks Timing:  Intermittent Progression:  Waxing and waning Chronicity:  New Relieved by:  Nothing Worsened by:  Exertion Ineffective treatments:  Rest Associated symptoms: shortness of breath   Associated symptoms: no dizziness, no fever, no headache, no nausea, no palpitations and no vomiting        Past Medical History:  Diagnosis Date  . Anemia aplastic aregenerative ([place])   . Anemia, iron deficiency   . Arthritis   . Breast cancer ([place])   . Chronic pain   . Factor V Leiden ([place])   . Fibromyalgia   . IC (interstitial cystitis)   . Ovarian cancer ([place])   . Protein S deficiency ([place])   . Pulmonary embolism ([place])   . Seizures ([place])   . Spinal compression fracture ([place])   . Thyroid cancer ([place])   . Thyroid cancer ([place])   .  Uterine cancer [facility])     Patient Active Problem List   Diagnosis Date Noted  . GI bleed 04/15/2019  . Bipolar 2 disorder, major depressive episode ([place]) 01/26/2018  . Major depressive disorder, recurrent severe without psychotic features ([place]) 01/24/2018  . GAD (generalized anxiety disorder) 01/24/2018  . Opiate withdrawal ([place])   . Opioid dependence with withdrawal ([place])   . Chronic back pain 02/14/2014  . Chronic neck pain 02/14/2014  . Fibromyalgia affecting multiple sites 02/14/2014  . Other chronic pain 02/14/2014    Past Surgical History:  Procedure Laterality Date  . ABDOMINAL EXPLORATION SURGERY    . ABDOMINAL HYSTERECTOMY    . APPENDECTOMY    . BIOPSY  04/16/2019   Procedure: BIOPSY;  Surgeon: Otis Brace, MD;  Location: [facility];  Service: Gastroenterology;;  . BREAST LUMPECTOMY    . CHOLECYSTECTOMY    . COMBINED AUGMENTATION MAMMAPLASTY AND ABDOMINOPLASTY    . ESOPHAGOGASTRODUODENOSCOPY (EGD) WITH PROPOFOL N/A 04/16/2019   Procedure: ESOPHAGOGASTRODUODENOSCOPY (EGD) WITH PROPOFOL;  Surgeon: Otis Brace, MD;  Location: [facility];  Service: Gastroenterology;  Laterality: N/A;  . GASTRIC BYPASS    . I & D EXTREMITY Left 01/18/2015   Procedure: IRRIGATION AND DEBRIDEMENT, PINNING  AND  REPAIR NAIL BED LEFT MIDDLE FINGER;  Surgeon: Leanora Cover, MD;  Location: [facility];  Service: Orthopedics;  Laterality: Left;  . SINUSOTOMY    . THYROIDECTOMY       OB History   No obstetric history on file.     Family History  Problem Relation Age of Onset  . Alcohol abuse Father     Social History   Tobacco Use  . Smoking status: Never Smoker  . Smokeless tobacco: Never Used  Vaping Use  . Vaping Use: Never used  Substance Use Topics  . Alcohol use: Yes    Comment: occassionally  . Drug use: Yes    Types: Marijuana    Home Medications Prior to Admission medications   Medication Sig Start Date End Date Taking? Authorizing Provider  busPIRone (BUSPAR)  15 MG tablet Take 1 tablet (15 mg total) by mouth daily as needed (Anxiety). 04/18/19   Dixie Dials, MD  Cyanocobalamin 1000 MCG/ML KIT Inject 1,000 mg as directed every 30 (thirty) days.    [provider]  DULoxetine (CYMBALTA) 60 MG capsule Take 1 capsule (60 mg total) by mouth 2 (two) times daily. 01/30/18   Suella Broad, FNP  ferrous sulfate 325 (65 FE) MG tablet Take 1 tablet (325 mg total) by mouth 2 (two) times daily with a meal. 06/19/16   Carlisle Cater, PA-C  Meth-Hyo-M Bl-Na Phos-Ph Sal (URIBEL) 118 MG CAPS Take 118 mg by mouth as needed (for bladder).  10/02/14   [provider]  Multiple Vitamin (MULTIVITAMIN) capsule Take 1 capsule by mouth daily.     [provider]  ondansetron (ZOFRAN-ODT) 4 MG disintegrating tablet Take 4 mg by mouth every 8 (eight) hours as needed for nausea or vomiting.    [provider]  Oxcarbazepine (TRILEPTAL) 300 MG tablet Take 1 tablet (300 mg total) by mouth 2 (two) times daily. 01/30/18   Starkes-Perry, Gayland Curry, FNP  pantoprazole (PROTONIX) 40 MG tablet Take 1 tablet (40 mg total) by mouth 2 (two) times daily. 04/18/19   Dixie Dials, MD  pentosan polysulfate (ELMIRON) 100 MG capsule Place 100 mg into the urethra as needed (for bladder).     [provider]  solifenacin (VESICARE) 10 MG tablet Take 10 mg by mouth daily as needed (bladder).     [provider]  sucralfate (CARAFATE) 1 GM/10ML suspension Take 10 mLs (1 g total) by mouth every 6 (six) hours. 04/18/19   Dixie Dials, MD  SUMAtriptan (IMITREX) 50 MG tablet Take 1 tablet (50 mg total) by mouth every 2 (two) hours as needed for migraine or headache. May repeat in 2 hours if headache persists or recurs. 01/30/18   Suella Broad, FNP  thyroid (ARMOUR) 60 MG tablet Take 60 mg by mouth daily before breakfast.    [provider]    Allergies    Ioxaglate, Ivp dye [iodinated diagnostic agents], Metrizamide, Other, Tramadol,  Carbamazepine, Buprenorphine, Divalproex sodium, Levetiracetam, Nsaids, Morphine, Topiramate, and Valproic acid  Review of Systems   Review of Systems  Constitutional: Negative for chills and fever.  HENT: Negative for congestion and rhinorrhea.   Eyes: Negative for redness and visual disturbance.  Respiratory: Positive for shortness of breath. Negative for wheezing.   Cardiovascular: Positive for chest pain. Negative for palpitations.  Gastrointestinal: Negative for nausea and vomiting.  Genitourinary: Negative for dysuria and urgency.  Musculoskeletal: Negative for arthralgias and myalgias.  Skin: Negative for pallor and wound.  Neurological: Negative for dizziness and headaches.    Physical Exam Updated Vital Signs BP 120/73 (BP  Location: Left Arm)   Pulse 84   Temp 98.3 F (36.8 C) (Oral)   Resp 19   Ht 5' 7" (1.702 m)   Wt 72.6 kg   SpO2 100%   BMI 25.06 kg/m   Physical Exam Vitals and nursing note reviewed.  Constitutional:      General: She is not in acute distress.    Appearance: She is well-developed. She is not diaphoretic.  HENT:     Head: Normocephalic and atraumatic.  Eyes:     Pupils: Pupils are equal, round, and reactive to light.  Cardiovascular:     Rate and Rhythm: Normal rate and regular rhythm.     Heart sounds: No murmur heard.  No friction rub. No gallop.   Pulmonary:     Effort: Pulmonary effort is normal.     Breath sounds: No wheezing or rales.  Abdominal:     General: There is no distension.     Palpations: Abdomen is soft.     Tenderness: There is no abdominal tenderness.  Musculoskeletal:        General: No tenderness.     Cervical back: Normal range of motion and neck supple.  Skin:    General: Skin is warm and dry.  Neurological:     Mental Status: She is alert and oriented to person, place, and time.  Psychiatric:        Behavior: Behavior normal.     ED Results / Procedures / Treatments   Labs (all labs ordered are listed,  but only abnormal results are displayed) Labs Reviewed  CBC WITH DIFFERENTIAL/PLATELET - Abnormal; Notable for the following components:      Result Value   Hemoglobin 11.2 (*)    HCT 35.0 (*)    All other components within normal limits  BASIC METABOLIC PANEL - Abnormal; Notable for the following components:   Glucose, Bld 105 (*)    Calcium 8.7 (*)    All other components within normal limits  BRAIN NATRIURETIC PEPTIDE  TROPONIN I (HIGH SENSITIVITY)    EKG None  Radiology DG Chest 2 View  Result Date: 05/10/2020 CLINICAL DATA:  39 year old female with chest pain. EXAM: CHEST - 2 VIEW COMPARISON:  Chest radiograph dated 09/06/2016. FINDINGS: The heart size and mediastinal contours are within normal limits. Both lungs are clear. The visualized skeletal structures are unremarkable. IMPRESSION: No active cardiopulmonary disease. Electronically Signed   By: Anner Crete M.D.   On: 05/10/2020 21:40    Procedures Procedures (including critical care time)  Medications Ordered in ED Medications  hydrocortisone sodium succinate (SOLU-CORTEF) injection 200 mg (200 mg Intravenous Not Given 05/10/20 2302)  diphenhydrAMINE (BENADRYL) capsule 50 mg (has no administration in time range)    Or  diphenhydrAMINE (BENADRYL) injection 50 mg (has no administration in time range)  hydrocortisone sodium succinate (SOLU-CORTEF) 100 MG injection (100 mg  Given 05/10/20 2311)    ED Course  I have reviewed the triage vital signs and the nursing notes.  Pertinent labs & imaging results that were available during my care of the patient were reviewed by me and considered in my medical decision making (see chart for details).    MDM Rules/Calculators/A&P                          39 yo F with a chief complaint of chest pain.  Patient with no risk factors for MI however she has had a pulmonary embolism in  the past.  Symptom has been off and on for a couple weeks with feeling of shortness of breath  with minimal exertion and feeling like she may pass out.  Will obtain a CT angiogram of the chest to evaluate for pulmonary embolism.  Patient unfortunately has a contrast dye allergy and so we will pretreat.  Troponin negative.  EKG without concerning finding.  She has had symptoms for more than 2 weeks off and on I do not feel reason to repeat her troponin.  Patient signed out to Dr. Kathrynn Humble, please see his note for further details care in ED.  The patients results and plan were reviewed and discussed.   Any x-rays performed were independently reviewed by myself.   Differential diagnosis were considered with the presenting HPI.  Medications  hydrocortisone sodium succinate (SOLU-CORTEF) injection 200 mg (200 mg Intravenous Not Given 05/10/20 2302)  diphenhydrAMINE (BENADRYL) capsule 50 mg (has no administration in time range)    Or  diphenhydrAMINE (BENADRYL) injection 50 mg (has no administration in time range)  hydrocortisone sodium succinate (SOLU-CORTEF) 100 MG injection (100 mg  Given 05/10/20 2311)    Vitals:   05/10/20 2012 05/10/20 2013 05/10/20 2210 05/10/20 2315  BP: 118/70  116/61 120/73  Pulse: 88  84 84  Resp: _0 Temp: 98.3 F (36.8 C)     TempSrc: Oral     SpO2: 100%  100% 100%  Weight:  72.6 kg    Height:  5' 7" (1.702 m)      Final diagnoses:  Nonspecific chest pain     Final Clinical Impression(s) / ED Diagnoses Final diagnoses:  Nonspecific chest pain    Rx / DC Orders ED Discharge Orders    None       Deno Etienne, DO 05/11/20 0016

## 2020-05-11 NOTE — ED Notes (Signed)
Discharge instructions discussed with patient. Plans to follow up with PCP in the next couple of days. Ambulatory to parking lot to head home with husband.

## 2020-05-11 NOTE — Discharge Instructions (Addendum)
We saw you in the ER for the chest pain/shortness of breath. All of our cardiac workup is normal, including labs, EKG and chest X-RAY are normal. We are not sure what is causing your discomfort, but we feel comfortable sending you home at this time. The workup in the ER is not complete, and you should follow up with your primary care doctor for further evaluation.

## 2020-07-11 DIAGNOSIS — E538 Deficiency of other specified B group vitamins: Secondary | ICD-10-CM | POA: Diagnosis not present

## 2020-07-11 DIAGNOSIS — E039 Hypothyroidism, unspecified: Secondary | ICD-10-CM | POA: Diagnosis not present

## 2020-07-11 DIAGNOSIS — E559 Vitamin D deficiency, unspecified: Secondary | ICD-10-CM | POA: Diagnosis not present

## 2020-09-02 DIAGNOSIS — Z9884 Bariatric surgery status: Secondary | ICD-10-CM | POA: Diagnosis not present

## 2020-09-02 DIAGNOSIS — D509 Iron deficiency anemia, unspecified: Secondary | ICD-10-CM | POA: Diagnosis not present

## 2020-09-02 DIAGNOSIS — D508 Other iron deficiency anemias: Secondary | ICD-10-CM | POA: Diagnosis not present

## 2020-09-02 DIAGNOSIS — E538 Deficiency of other specified B group vitamins: Secondary | ICD-10-CM | POA: Diagnosis not present

## 2020-10-07 DIAGNOSIS — H5213 Myopia, bilateral: Secondary | ICD-10-CM | POA: Diagnosis not present

## 2020-10-20 DIAGNOSIS — H52223 Regular astigmatism, bilateral: Secondary | ICD-10-CM | POA: Diagnosis not present

## 2020-10-20 DIAGNOSIS — H5213 Myopia, bilateral: Secondary | ICD-10-CM | POA: Diagnosis not present

## 2020-11-18 DIAGNOSIS — R072 Precordial pain: Secondary | ICD-10-CM | POA: Diagnosis not present

## 2020-11-18 DIAGNOSIS — G43009 Migraine without aura, not intractable, without status migrainosus: Secondary | ICD-10-CM | POA: Diagnosis not present

## 2020-11-18 DIAGNOSIS — R0602 Shortness of breath: Secondary | ICD-10-CM | POA: Diagnosis not present

## 2020-11-18 DIAGNOSIS — M797 Fibromyalgia: Secondary | ICD-10-CM | POA: Diagnosis not present

## 2020-11-18 DIAGNOSIS — N3020 Other chronic cystitis without hematuria: Secondary | ICD-10-CM | POA: Diagnosis not present

## 2020-11-18 DIAGNOSIS — E038 Other specified hypothyroidism: Secondary | ICD-10-CM | POA: Diagnosis not present

## 2020-12-01 DIAGNOSIS — Z20822 Contact with and (suspected) exposure to covid-19: Secondary | ICD-10-CM | POA: Diagnosis not present

## 2020-12-29 DIAGNOSIS — M797 Fibromyalgia: Secondary | ICD-10-CM | POA: Diagnosis not present

## 2020-12-29 DIAGNOSIS — M549 Dorsalgia, unspecified: Secondary | ICD-10-CM | POA: Diagnosis not present

## 2020-12-29 DIAGNOSIS — Z79899 Other long term (current) drug therapy: Secondary | ICD-10-CM | POA: Diagnosis not present

## 2020-12-31 IMAGING — CT CT ANGIO CHEST
2 of 9 series · 19 of 36 positions shown · IV contrast (Omnipaque)
Comparison: Chest radiograph dated 05/10/2020.

CLINICAL DATA: 39-year-old female with concern for pulmonary
embolism.

EXAM:
CT ANGIOGRAPHY CHEST WITH CONTRAST
TECHNIQUE: Multidetector CT imaging of the chest was performed using the
standard protocol during bolus administration of intravenous
contrast. Multiplanar CT image reconstructions and MIPs were
obtained to evaluate the vascular anatomy.
CONTRAST:  100mL OMNIPAQUE IOHEXOL 350 MG/ML SOLN

[Series 7: pe coronal mpr · coronal · 0.59mm/px · 1 of 113 slices shown]
[im 57/113  mediastinal]
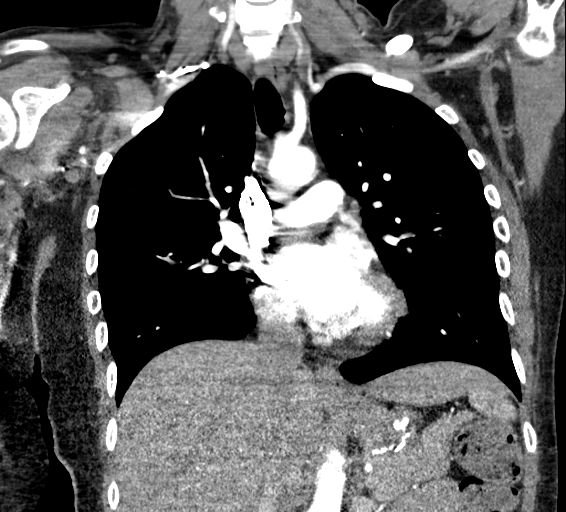

[Series 11: pe thins · axial · 0.65mm/px · z∈[-126,+144]mm · 18 of 303 slices shown]
[im 16/303  lung]
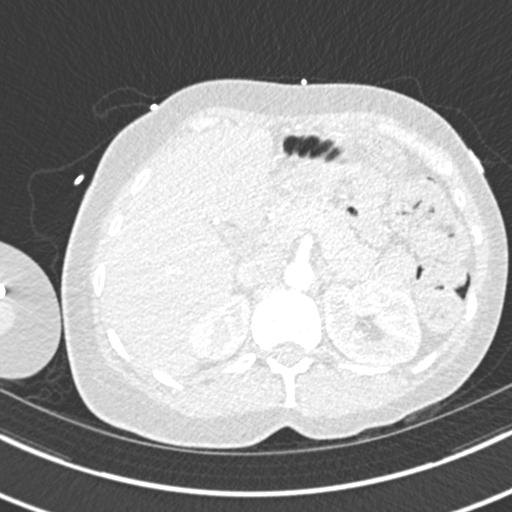
[im 32/303  mediastinal]
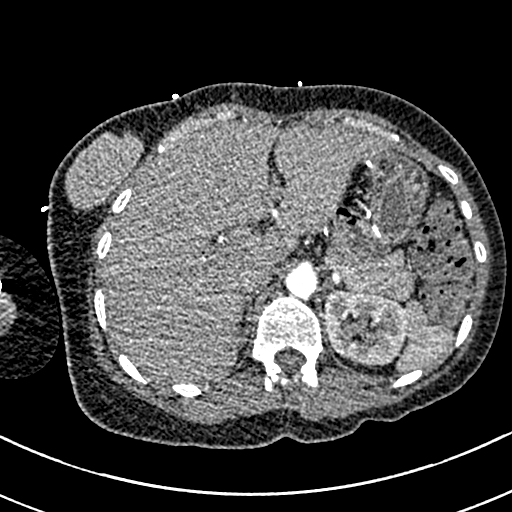
[im 48/303  lung]
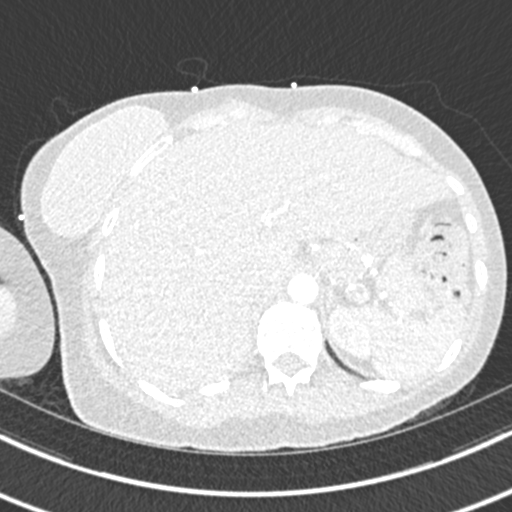
[im 64/303  mediastinal]
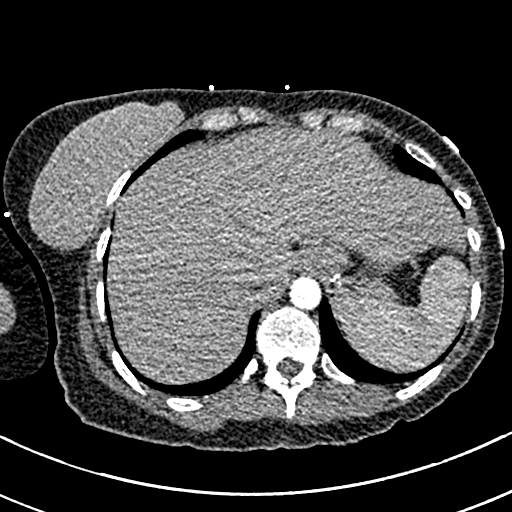
[im 80/303  lung]
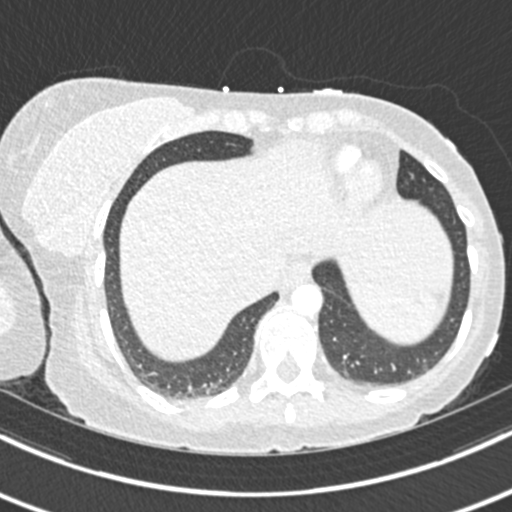
[im 96/303  mediastinal]
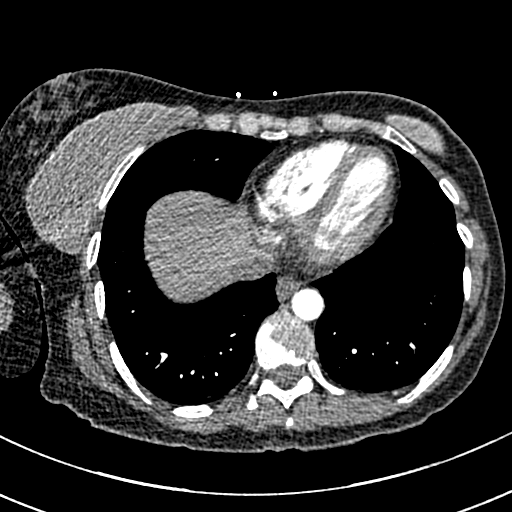
[im 112/303  lung]
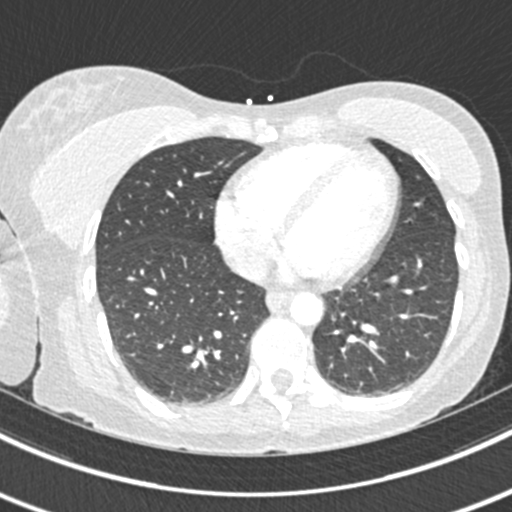
[im 128/303  mediastinal]
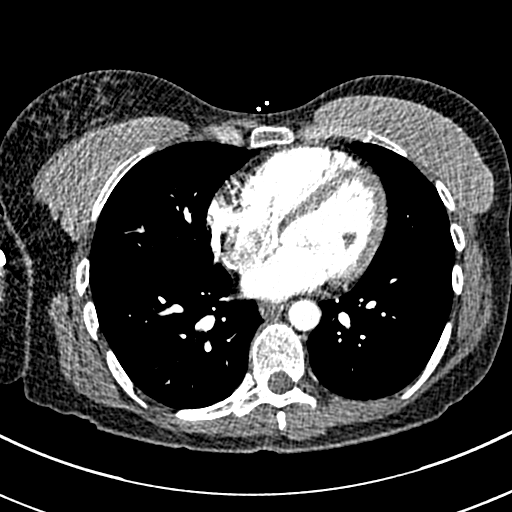
[im 144/303  lung]
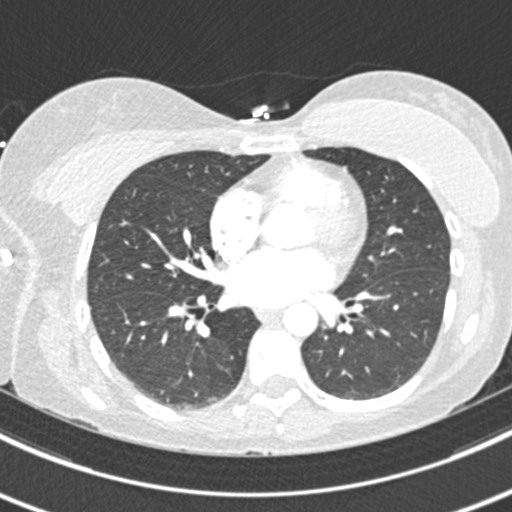
[im 159/303  mediastinal]
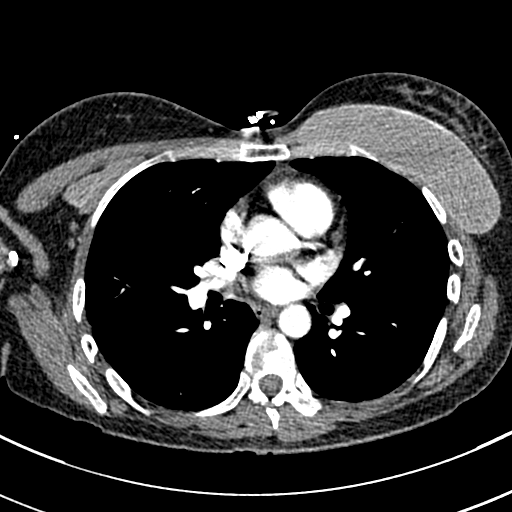
[im 175/303  lung]
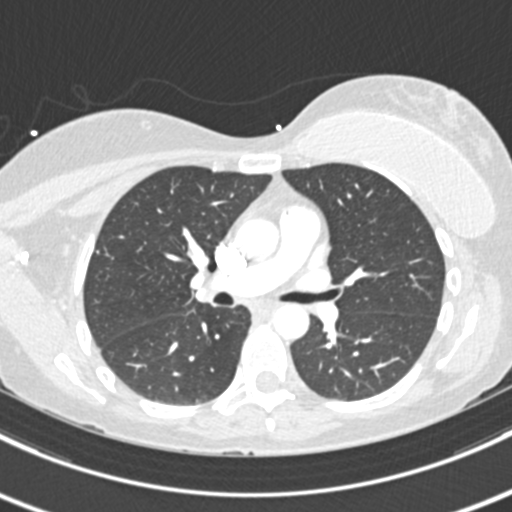
[im 191/303  mediastinal]
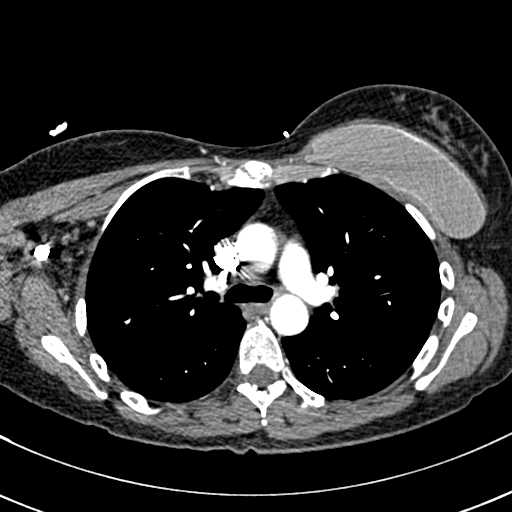
[im 207/303  lung]
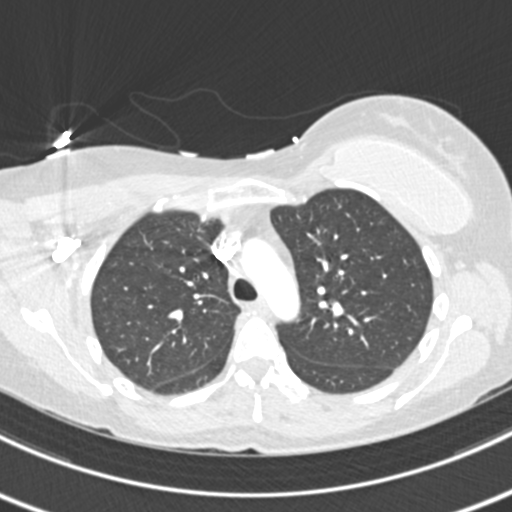
[im 223/303  mediastinal]
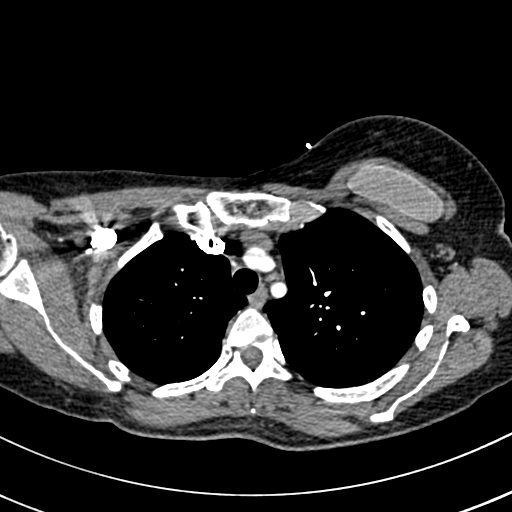
[im 239/303  lung]
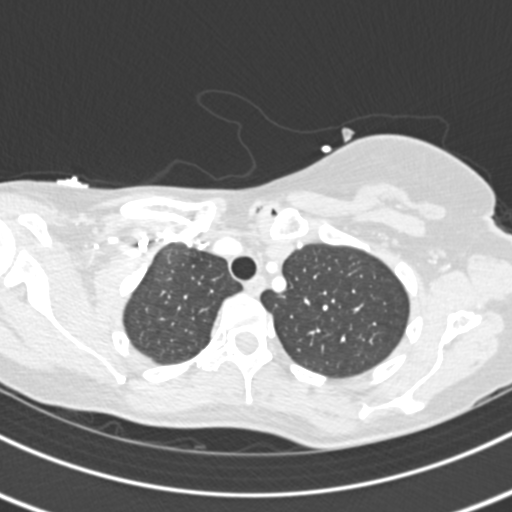
[im 255/303  mediastinal]
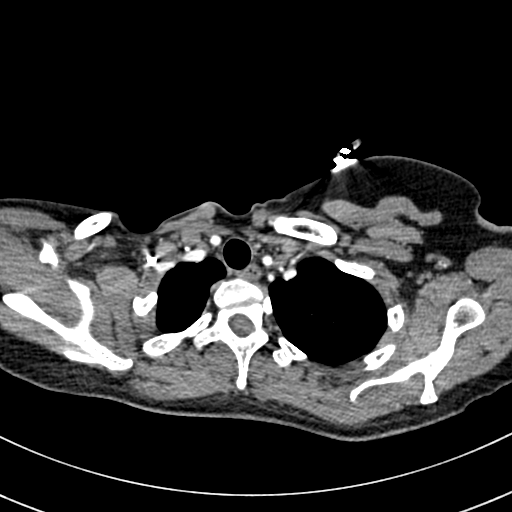
[im 271/303  lung]
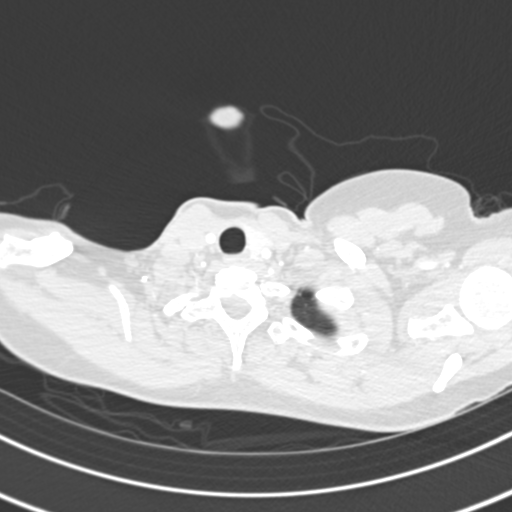
[im 287/303  mediastinal]
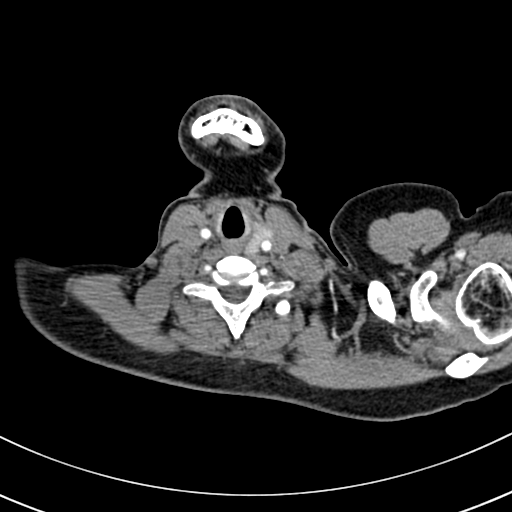

[19 of 36 positions shown; findings below may reference images not displayed]

FINDINGS: Cardiovascular: There is no cardiomegaly or pericardial effusion.
The thoracic aorta is unremarkable. The origins of the great vessels
of the aortic arch appear patent. No pulmonary artery embolus
identified.

Mediastinum/Nodes: No hilar or mediastinal adenopathy. The esophagus
is grossly unremarkable. Prior right hemithyroidectomy. No
mediastinal fluid collection.

Lungs/Pleura: The lungs are clear. There is no pleural effusion
pneumothorax. The central airways are patent.

Upper Abdomen: Postsurgical changes of gastric bypass.
Cholecystectomy.

Musculoskeletal: Bilateral breast implants. No acute osseous
pathology.

Review of the MIP images confirms the above findings.
IMPRESSION: No acute intrathoracic pathology. No CT evidence of pulmonary
embolism.

## 2021-01-09 DIAGNOSIS — E559 Vitamin D deficiency, unspecified: Secondary | ICD-10-CM | POA: Diagnosis not present

## 2021-01-09 DIAGNOSIS — E538 Deficiency of other specified B group vitamins: Secondary | ICD-10-CM | POA: Diagnosis not present

## 2021-01-09 DIAGNOSIS — R635 Abnormal weight gain: Secondary | ICD-10-CM | POA: Diagnosis not present

## 2021-01-09 DIAGNOSIS — E039 Hypothyroidism, unspecified: Secondary | ICD-10-CM | POA: Diagnosis not present

## 2021-03-31 DIAGNOSIS — M549 Dorsalgia, unspecified: Secondary | ICD-10-CM | POA: Diagnosis not present

## 2021-03-31 DIAGNOSIS — R252 Cramp and spasm: Secondary | ICD-10-CM | POA: Diagnosis not present

## 2021-04-10 DIAGNOSIS — Z20822 Contact with and (suspected) exposure to covid-19: Secondary | ICD-10-CM | POA: Diagnosis not present

## 2021-04-22 DIAGNOSIS — M549 Dorsalgia, unspecified: Secondary | ICD-10-CM | POA: Diagnosis not present

## 2021-04-22 DIAGNOSIS — R252 Cramp and spasm: Secondary | ICD-10-CM | POA: Diagnosis not present

## 2021-04-24 DIAGNOSIS — D508 Other iron deficiency anemias: Secondary | ICD-10-CM | POA: Diagnosis not present

## 2021-04-24 DIAGNOSIS — Z9884 Bariatric surgery status: Secondary | ICD-10-CM | POA: Diagnosis not present

## 2021-04-24 DIAGNOSIS — E538 Deficiency of other specified B group vitamins: Secondary | ICD-10-CM | POA: Diagnosis not present

## 2021-04-29 DIAGNOSIS — R531 Weakness: Secondary | ICD-10-CM | POA: Diagnosis not present

## 2021-04-29 DIAGNOSIS — R252 Cramp and spasm: Secondary | ICD-10-CM | POA: Diagnosis not present

## 2021-04-29 DIAGNOSIS — M549 Dorsalgia, unspecified: Secondary | ICD-10-CM | POA: Diagnosis not present

## 2021-05-11 DIAGNOSIS — R252 Cramp and spasm: Secondary | ICD-10-CM | POA: Diagnosis not present

## 2021-05-11 DIAGNOSIS — R531 Weakness: Secondary | ICD-10-CM | POA: Diagnosis not present

## 2021-05-11 DIAGNOSIS — M549 Dorsalgia, unspecified: Secondary | ICD-10-CM | POA: Diagnosis not present

## 2021-06-30 DIAGNOSIS — M549 Dorsalgia, unspecified: Secondary | ICD-10-CM | POA: Diagnosis not present

## 2021-06-30 DIAGNOSIS — M546 Pain in thoracic spine: Secondary | ICD-10-CM | POA: Diagnosis not present

## 2021-06-30 DIAGNOSIS — M797 Fibromyalgia: Secondary | ICD-10-CM | POA: Diagnosis not present

## 2021-06-30 DIAGNOSIS — Z79899 Other long term (current) drug therapy: Secondary | ICD-10-CM | POA: Diagnosis not present

## 2021-08-13 DIAGNOSIS — M5414 Radiculopathy, thoracic region: Secondary | ICD-10-CM | POA: Diagnosis not present

## 2021-08-13 DIAGNOSIS — M549 Dorsalgia, unspecified: Secondary | ICD-10-CM | POA: Diagnosis not present

## 2021-08-20 DIAGNOSIS — M546 Pain in thoracic spine: Secondary | ICD-10-CM | POA: Diagnosis not present

## 2021-08-20 DIAGNOSIS — M4724 Other spondylosis with radiculopathy, thoracic region: Secondary | ICD-10-CM | POA: Diagnosis not present

## 2021-08-20 DIAGNOSIS — M4854XA Collapsed vertebra, not elsewhere classified, thoracic region, initial encounter for fracture: Secondary | ICD-10-CM | POA: Diagnosis not present

## 2021-08-24 DIAGNOSIS — Z1231 Encounter for screening mammogram for malignant neoplasm of breast: Secondary | ICD-10-CM | POA: Diagnosis not present

## 2021-09-02 DIAGNOSIS — M50322 Other cervical disc degeneration at C5-C6 level: Secondary | ICD-10-CM | POA: Diagnosis not present

## 2021-09-02 DIAGNOSIS — M47814 Spondylosis without myelopathy or radiculopathy, thoracic region: Secondary | ICD-10-CM | POA: Diagnosis not present

## 2021-09-02 DIAGNOSIS — M533 Sacrococcygeal disorders, not elsewhere classified: Secondary | ICD-10-CM | POA: Diagnosis not present

## 2021-09-02 DIAGNOSIS — M7918 Myalgia, other site: Secondary | ICD-10-CM | POA: Diagnosis not present

## 2021-09-02 DIAGNOSIS — M546 Pain in thoracic spine: Secondary | ICD-10-CM | POA: Diagnosis not present

## 2021-09-02 DIAGNOSIS — M719 Bursopathy, unspecified: Secondary | ICD-10-CM | POA: Diagnosis not present

## 2021-09-02 DIAGNOSIS — M542 Cervicalgia: Secondary | ICD-10-CM | POA: Diagnosis not present

## 2021-09-15 DIAGNOSIS — M719 Bursopathy, unspecified: Secondary | ICD-10-CM | POA: Diagnosis not present

## 2021-09-15 DIAGNOSIS — M7918 Myalgia, other site: Secondary | ICD-10-CM | POA: Diagnosis not present

## 2021-09-15 DIAGNOSIS — M546 Pain in thoracic spine: Secondary | ICD-10-CM | POA: Diagnosis not present

## 2021-09-15 DIAGNOSIS — M542 Cervicalgia: Secondary | ICD-10-CM | POA: Diagnosis not present

## 2021-09-22 DIAGNOSIS — M719 Bursopathy, unspecified: Secondary | ICD-10-CM | POA: Diagnosis not present

## 2021-09-22 DIAGNOSIS — M546 Pain in thoracic spine: Secondary | ICD-10-CM | POA: Diagnosis not present

## 2021-09-22 DIAGNOSIS — M7918 Myalgia, other site: Secondary | ICD-10-CM | POA: Diagnosis not present

## 2021-09-22 DIAGNOSIS — M542 Cervicalgia: Secondary | ICD-10-CM | POA: Diagnosis not present

## 2021-10-02 DIAGNOSIS — M7918 Myalgia, other site: Secondary | ICD-10-CM | POA: Diagnosis not present

## 2021-10-12 DIAGNOSIS — R531 Weakness: Secondary | ICD-10-CM | POA: Diagnosis not present

## 2021-10-12 DIAGNOSIS — M546 Pain in thoracic spine: Secondary | ICD-10-CM | POA: Diagnosis not present

## 2021-10-12 DIAGNOSIS — M719 Bursopathy, unspecified: Secondary | ICD-10-CM | POA: Diagnosis not present

## 2021-10-12 DIAGNOSIS — M7918 Myalgia, other site: Secondary | ICD-10-CM | POA: Diagnosis not present

## 2021-10-12 DIAGNOSIS — M549 Dorsalgia, unspecified: Secondary | ICD-10-CM | POA: Diagnosis not present

## 2021-10-12 DIAGNOSIS — M542 Cervicalgia: Secondary | ICD-10-CM | POA: Diagnosis not present

## 2021-10-22 DIAGNOSIS — D508 Other iron deficiency anemias: Secondary | ICD-10-CM | POA: Diagnosis not present

## 2021-10-26 DIAGNOSIS — M719 Bursopathy, unspecified: Secondary | ICD-10-CM | POA: Diagnosis not present

## 2021-10-26 DIAGNOSIS — M546 Pain in thoracic spine: Secondary | ICD-10-CM | POA: Diagnosis not present

## 2021-10-26 DIAGNOSIS — R531 Weakness: Secondary | ICD-10-CM | POA: Diagnosis not present

## 2021-10-26 DIAGNOSIS — M7918 Myalgia, other site: Secondary | ICD-10-CM | POA: Diagnosis not present

## 2021-10-26 DIAGNOSIS — M542 Cervicalgia: Secondary | ICD-10-CM | POA: Diagnosis not present

## 2021-11-12 DIAGNOSIS — M47814 Spondylosis without myelopathy or radiculopathy, thoracic region: Secondary | ICD-10-CM | POA: Diagnosis not present

## 2021-11-12 DIAGNOSIS — M719 Bursopathy, unspecified: Secondary | ICD-10-CM | POA: Diagnosis not present

## 2021-11-12 DIAGNOSIS — M533 Sacrococcygeal disorders, not elsewhere classified: Secondary | ICD-10-CM | POA: Diagnosis not present

## 2021-11-12 DIAGNOSIS — M7918 Myalgia, other site: Secondary | ICD-10-CM | POA: Diagnosis not present

## 2021-12-14 DIAGNOSIS — M533 Sacrococcygeal disorders, not elsewhere classified: Secondary | ICD-10-CM | POA: Diagnosis not present

## 2022-01-05 DIAGNOSIS — Z79899 Other long term (current) drug therapy: Secondary | ICD-10-CM | POA: Diagnosis not present

## 2022-01-05 DIAGNOSIS — M797 Fibromyalgia: Secondary | ICD-10-CM | POA: Diagnosis not present

## 2022-01-05 DIAGNOSIS — G894 Chronic pain syndrome: Secondary | ICD-10-CM | POA: Diagnosis not present

## 2022-01-05 DIAGNOSIS — M2550 Pain in unspecified joint: Secondary | ICD-10-CM | POA: Diagnosis not present

## 2022-01-19 DIAGNOSIS — M7918 Myalgia, other site: Secondary | ICD-10-CM | POA: Diagnosis not present

## 2022-01-19 DIAGNOSIS — M533 Sacrococcygeal disorders, not elsewhere classified: Secondary | ICD-10-CM | POA: Diagnosis not present

## 2022-02-18 DIAGNOSIS — G8929 Other chronic pain: Secondary | ICD-10-CM | POA: Diagnosis not present

## 2022-02-18 DIAGNOSIS — M533 Sacrococcygeal disorders, not elsewhere classified: Secondary | ICD-10-CM | POA: Diagnosis not present

## 2022-03-08 DIAGNOSIS — M719 Bursopathy, unspecified: Secondary | ICD-10-CM | POA: Diagnosis not present

## 2022-03-23 DIAGNOSIS — M533 Sacrococcygeal disorders, not elsewhere classified: Secondary | ICD-10-CM | POA: Diagnosis not present

## 2022-04-12 DIAGNOSIS — Z809 Family history of malignant neoplasm, unspecified: Secondary | ICD-10-CM | POA: Diagnosis not present

## 2022-04-12 DIAGNOSIS — Z9189 Other specified personal risk factors, not elsewhere classified: Secondary | ICD-10-CM | POA: Diagnosis not present

## 2022-04-27 DIAGNOSIS — Z9884 Bariatric surgery status: Secondary | ICD-10-CM | POA: Diagnosis not present

## 2022-04-27 DIAGNOSIS — D508 Other iron deficiency anemias: Secondary | ICD-10-CM | POA: Diagnosis not present

## 2022-05-04 DIAGNOSIS — E559 Vitamin D deficiency, unspecified: Secondary | ICD-10-CM | POA: Diagnosis not present

## 2022-05-04 DIAGNOSIS — R635 Abnormal weight gain: Secondary | ICD-10-CM | POA: Diagnosis not present

## 2022-05-04 DIAGNOSIS — E039 Hypothyroidism, unspecified: Secondary | ICD-10-CM | POA: Diagnosis not present

## 2022-07-08 DIAGNOSIS — Z79899 Other long term (current) drug therapy: Secondary | ICD-10-CM | POA: Diagnosis not present

## 2022-07-08 DIAGNOSIS — M2550 Pain in unspecified joint: Secondary | ICD-10-CM | POA: Diagnosis not present

## 2022-09-13 ENCOUNTER — Emergency Department (HOSPITAL_BASED_OUTPATIENT_CLINIC_OR_DEPARTMENT_OTHER)
Admission: EM | Admit: 2022-09-13 | Discharge: 2022-09-13 | Disposition: A | Discharge status: Home / Self Care | Payer: Medicaid Other | Attending: Emergency Medicine | Admitting: Emergency Medicine

## 2022-09-13 ENCOUNTER — Emergency Department (HOSPITAL_BASED_OUTPATIENT_CLINIC_OR_DEPARTMENT_OTHER): Payer: Medicaid Other

## 2022-09-13 ENCOUNTER — Other Ambulatory Visit: Payer: Self-pay

## 2022-09-13 ENCOUNTER — Encounter (HOSPITAL_BASED_OUTPATIENT_CLINIC_OR_DEPARTMENT_OTHER): Payer: Self-pay

## 2022-09-13 DIAGNOSIS — R110 Nausea: Secondary | ICD-10-CM | POA: Diagnosis not present

## 2022-09-13 DIAGNOSIS — R0602 Shortness of breath: Secondary | ICD-10-CM | POA: Diagnosis not present

## 2022-09-13 DIAGNOSIS — Z1152 Encounter for screening for COVID-19: Secondary | ICD-10-CM | POA: Diagnosis not present

## 2022-09-13 DIAGNOSIS — Z79899 Other long term (current) drug therapy: Secondary | ICD-10-CM | POA: Insufficient documentation

## 2022-09-13 DIAGNOSIS — I10 Essential (primary) hypertension: Secondary | ICD-10-CM | POA: Diagnosis not present

## 2022-09-13 DIAGNOSIS — R0789 Other chest pain: Secondary | ICD-10-CM | POA: Diagnosis not present

## 2022-09-13 DIAGNOSIS — Z85850 Personal history of malignant neoplasm of thyroid: Secondary | ICD-10-CM | POA: Insufficient documentation

## 2022-09-13 DIAGNOSIS — Z7901 Long term (current) use of anticoagulants: Secondary | ICD-10-CM | POA: Insufficient documentation

## 2022-09-13 DIAGNOSIS — R079 Chest pain, unspecified: Secondary | ICD-10-CM | POA: Diagnosis not present

## 2022-09-13 LAB — BASIC METABOLIC PANEL
Anion gap: 6 (ref 5–15)
BUN: 9 mg/dL (ref 6–20)
CO2: 23 mmol/L (ref 22–32)
Calcium: 8.5 mg/dL — ABNORMAL LOW (ref 8.9–10.3)
Chloride: 108 mmol/L (ref 98–111)
Creatinine, Ser: 0.57 mg/dL (ref 0.44–1.00)
GFR, Estimated: >60 mL/min (ref >60)
Glucose, Bld: 91 mg/dL (ref 70–99)
Potassium: 4 mmol/L (ref 3.5–5.1)
Sodium: 137 mmol/L (ref 135–145)

## 2022-09-13 LAB — RESP PANEL BY RT-PCR (RSV, FLU A&B, COVID)  RVPGX2
Influenza A by PCR: NEGATIVE
Influenza B by PCR: NEGATIVE
Resp Syncytial Virus by PCR: NEGATIVE
SARS Coronavirus 2 by RT PCR: NEGATIVE

## 2022-09-13 LAB — CBC
HCT: 39.4 % (ref 36.0–46.0)
Hemoglobin: 12.9 g/dL (ref 12.0–15.0)
MCH: 30.1 pg (ref 26.0–34.0)
MCHC: 32.7 g/dL (ref 30.0–36.0)
MCV: 91.8 fL (ref 80.0–100.0)
Platelets: 268 10*3/uL (ref 150–400)
RBC: 4.29 MIL/uL (ref 3.87–5.11)
RDW: 11.9 % (ref 11.5–15.5)
WBC: 4.8 10*3/uL (ref 4.0–10.5)
nRBC: 0 % (ref 0.0–0.2)

## 2022-09-13 LAB — PREGNANCY, URINE: Preg Test, Ur: NEGATIVE

## 2022-09-13 LAB — TROPONIN I (HIGH SENSITIVITY)
Troponin I (High Sensitivity): <2 ng/L (ref <18)
Troponin I (High Sensitivity): 2 ng/L (ref <18)

## 2022-09-13 MED ORDER — DIPHENHYDRAMINE HCL 50 MG/ML IJ SOLN: 50.0000 mg | Freq: Once | INTRAMUSCULAR | Status: AC

## 2022-09-13 MED ORDER — IOHEXOL 350 MG/ML SOLN
75.0000 mL | Freq: Once | INTRAVENOUS | Status: AC | PRN
  Administered 2022-09-13: 75 mL via INTRAVENOUS

## 2022-09-13 MED ORDER — METHYLPREDNISOLONE SODIUM SUCC 40 MG IJ SOLR
40.0000 mg | Freq: Once | INTRAMUSCULAR | Status: AC
  Administered 2022-09-13: 40 mg via INTRAVENOUS
  Filled 2022-09-13: qty 1

## 2022-09-13 MED ORDER — DIPHENHYDRAMINE HCL 25 MG PO CAPS
50.0000 mg | ORAL_CAPSULE | Freq: Once | ORAL | Status: AC
  Administered 2022-09-13: 50 mg via ORAL
  Filled 2022-09-13: qty 2

## 2022-09-13 MED ORDER — DIPHENHYDRAMINE HCL 25 MG PO CAPS: 50.0000 mg | ORAL_CAPSULE | Freq: Once | ORAL | Status: DC

## 2022-09-13 MED ORDER — DIPHENHYDRAMINE HCL 50 MG/ML IJ SOLN: 50.0000 mg | Freq: Once | INTRAMUSCULAR | Status: DC

## 2022-09-13 NOTE — ED Provider Notes (Signed)
[facility] Provider Note   CSN: [id number] Arrival date & time: 09/13/22  1129     History  Chief Complaint  Patient presents with   Chest Pain    Alexis Singleton is a 42 y.o. female.  With history of bipolar 2, fibromyalgia, anxiety, depression, factor V Leiden, iron deficiency anemia, thyroid cancer, questionable pulmonary embolism who presents to the ED for evaluation of left-sided chest pain.  She states that yesterday morning she was cleaning her apartment when she lifted her left arm over her head and immediately felt a pain on the left side of her sternum.  She states this pain has persisted throughout the night and into today.  It is worse with movement and almost goes away at rest.  States pain is 10 out of 10 with movement and 1 out of 10 at rest.  Has some associated shortness of breath and diaphoresis when she feels the pain.  She states she has maybe had a PE in the past but was treated with Eliquis.  Denies history of DVT.  Denies unilateral leg swelling, hemoptysis, recent surgery or travel, estrogen or progesterone use.  She states the pain occasionally radiates to the back as well.   Chest Pain Associated symptoms: shortness of breath        Home Medications Prior to Admission medications   Medication Sig Start Date End Date Taking? Authorizing Provider  busPIRone (BUSPAR) 15 MG tablet Take 1 tablet (15 mg total) by mouth daily as needed (Anxiety). 04/18/19   Dixie Dials, MD  Cyanocobalamin 1000 MCG/ML KIT Inject 1,000 mg as directed every 30 (thirty) days.    [provider]  DULoxetine (CYMBALTA) 60 MG capsule Take 1 capsule (60 mg total) by mouth 2 (two) times daily. 01/30/18   Suella Broad, FNP  ferrous sulfate 325 (65 FE) MG tablet Take 1 tablet (325 mg total) by mouth 2 (two) times daily with a meal. 06/19/16   Carlisle Cater, PA-C  Meth-Hyo-M Bl-Na Phos-Ph Sal (URIBEL) 118 MG CAPS Take 118 mg by  mouth as needed (for bladder).  10/02/14   [provider]  Multiple Vitamin (MULTIVITAMIN) capsule Take 1 capsule by mouth daily.     [provider]  ondansetron (ZOFRAN-ODT) 4 MG disintegrating tablet Take 4 mg by mouth every 8 (eight) hours as needed for nausea or vomiting.    [provider]  Oxcarbazepine (TRILEPTAL) 300 MG tablet Take 1 tablet (300 mg total) by mouth 2 (two) times daily. 01/30/18   Starkes-Perry, Gayland Curry, FNP  pantoprazole (PROTONIX) 40 MG tablet Take 1 tablet (40 mg total) by mouth 2 (two) times daily. 04/18/19   Dixie Dials, MD  pentosan polysulfate (ELMIRON) 100 MG capsule Place 100 mg into the urethra as needed (for bladder).     [provider]  solifenacin (VESICARE) 10 MG tablet Take 10 mg by mouth daily as needed (bladder).     [provider]  sucralfate (CARAFATE) 1 GM/10ML suspension Take 10 mLs (1 g total) by mouth every 6 (six) hours. 04/18/19   Dixie Dials, MD  SUMAtriptan (IMITREX) 50 MG tablet Take 1 tablet (50 mg total) by mouth every 2 (two) hours as needed for migraine or headache. May repeat in 2 hours if headache persists or recurs. 01/30/18   Starkes-Perry, Gayland Curry, FNP  thyroid (ARMOUR) 60 MG tablet Take 60 mg by mouth daily before breakfast.    [provider]  Allergies    Ioxaglate, Ivp dye [iodinated contrast media], Metrizamide, Other, Tramadol, Carbamazepine, Buprenorphine, Divalproex sodium, Levetiracetam, Nsaids, Morphine, Topiramate, and Valproic acid    Review of Systems   Review of Systems  Respiratory:  Positive for shortness of breath.   Cardiovascular:  Positive for chest pain.  All other systems reviewed and are negative.   Physical Exam Updated Vital Signs BP 120/70 (BP Location: Right Arm)   Pulse 84   Temp 98.7 F (37.1 C) (Oral)   Resp 16   Ht 5' 6"$  (1.676 m)   Wt 72.6 kg   SpO2 99%   BMI 25.82 kg/m  Physical Exam Vitals and nursing note reviewed.   Constitutional:      General: She is not in acute distress.    Appearance: She is well-developed. She is not ill-appearing, toxic-appearing or diaphoretic.     Comments: Resting comfortably in bed  HENT:     Head: Normocephalic and atraumatic.  Eyes:     Conjunctiva/sclera: Conjunctivae normal.  Cardiovascular:     Rate and Rhythm: Normal rate and regular rhythm.     Pulses:          Radial pulses are 2+ on the right side and 2+ on the left side.       Posterior tibial pulses are 2+ on the right side and 2+ on the left side.     Heart sounds: No murmur heard. Pulmonary:     Effort: Pulmonary effort is normal. No respiratory distress.     Breath sounds: Normal breath sounds. No decreased breath sounds, wheezing, rhonchi or rales.  Chest:     Chest wall: Tenderness (Left chest wall) present.  Abdominal:     Palpations: Abdomen is soft.     Tenderness: There is no abdominal tenderness.  Musculoskeletal:        General: No swelling.     Cervical back: Neck supple.     Right lower leg: No edema.     Left lower leg: No edema.  Skin:    General: Skin is warm and dry.     Capillary Refill: Capillary refill takes less than 2 seconds.  Neurological:     General: No focal deficit present.     Mental Status: She is alert and oriented to person, place, and time.  Psychiatric:        Mood and Affect: Mood normal.     ED Results / Procedures / Treatments   Labs (all labs ordered are listed, but only abnormal results are displayed) Labs Reviewed  BASIC METABOLIC PANEL - Abnormal; Notable for the following components:      Result Value   Calcium 8.5 (*)    All other components within normal limits  RESP PANEL BY RT-PCR (RSV, FLU A&B, COVID)  RVPGX2  CBC  PREGNANCY, URINE  TROPONIN I (HIGH SENSITIVITY)  TROPONIN I (HIGH SENSITIVITY)    EKG EKG Interpretation  Date/Time:  Monday September 13 2022 11:40:03 EST Ventricular Rate:  90 PR Interval:  136 QRS Duration: 98 QT  Interval:  350 QTC Calculation: 429 R Axis:   84 Text Interpretation: Sinus rhythm Probable left atrial enlargement Borderline T abnormalities, anterior leads Confirmed by Regan Lemming ([id number]) on 09/13/2022 3:43:41 PM  Radiology CT Angio Chest PE W/Cm &/Or Wo Cm  Result Date: 09/13/2022 CLINICAL DATA:  Chest pain short of breath EXAM: CT ANGIOGRAPHY CHEST WITH CONTRAST TECHNIQUE: Multidetector CT imaging of the chest was performed using the standard protocol during bolus  administration of intravenous contrast. Multiplanar CT image reconstructions and MIPs were obtained to evaluate the vascular anatomy. RADIATION DOSE REDUCTION: This exam was performed according to the departmental dose-optimization program which includes automated exposure control, adjustment of the mA and/or kV according to patient size and/or use of iterative reconstruction technique. CONTRAST:  90m OMNIPAQUE IOHEXOL 350 MG/ML SOLN COMPARISON:  Chest x-ray 09/13/2022, CT chest 05/11/2020 FINDINGS: Cardiovascular: Satisfactory opacification of the pulmonary arteries to the segmental level. No evidence of pulmonary embolism. Normal heart size. No pericardial effusion. Nonaneurysmal aorta. No dissection Mediastinum/Nodes: No enlarged mediastinal, hilar, or axillary lymph nodes. Thyroid gland, trachea, and esophagus demonstrate no significant findings. Lungs/Pleura: Lungs are clear. No pleural effusion or pneumothorax. Upper Abdomen: Status post gastric bypass. Moderate debris within the gastric pouch. Musculoskeletal: No chest wall abnormality. No acute or significant osseous findings. Review of the MIP images confirms the above findings. IMPRESSION: Negative for acute pulmonary embolus or aortic dissection. Clear lung fields. Electronically Signed   By: KDonavan FoilM.D.   On: 09/13/2022 20:26   DG Chest 2 View  Result Date: 09/13/2022 CLINICAL DATA:  Chest pain, nausea, and shortness of breath. EXAM: CHEST - 2 VIEW COMPARISON:  Chest  two views 05/10/2020 FINDINGS: Cardiac silhouette and mediastinal contours are within normal limits. The lungs are clear. No pleural effusion or pneumothorax. Unchanged minimal anterior wedging of a midthoracic vertebral body, chronic. Right upper quadrant cholecystectomy clips. IMPRESSION: No active cardiopulmonary disease. Electronically Signed   By: RYvonne KendallM.D.   On: 09/13/2022 12:02    Procedures Procedures    Medications Ordered in ED Medications  methylPREDNISolone sodium succinate (SOLU-MEDROL) 40 mg/mL injection 40 mg (40 mg Intravenous Given 09/13/22 1612)  diphenhydrAMINE (BENADRYL) capsule 50 mg (50 mg Oral Given 09/13/22 1905)    Or  diphenhydrAMINE (BENADRYL) injection 50 mg ( Intravenous See Alternative 09/13/22 1905)  iohexol (OMNIPAQUE) 350 MG/ML injection 75 mL (75 mLs Intravenous Contrast Given 09/13/22 2004)    ED Course/ Medical Decision Making/ A&P             HEART Score: 1                Medical Decision Making Amount and/or Complexity of Data Reviewed Labs: ordered. Radiology: ordered.  Risk Prescription drug management.  This patient presents to the ED for concern of left-sided chest pain, this involves an extensive number of treatment options, and is a complaint that carries with it a high risk of complications and morbidity. The emergent differential diagnosis of chest pain includes: Acute coronary syndrome, pericarditis, aortic dissection, pulmonary embolism, tension pneumothorax, and esophageal rupture.  I do not believe the patient has an emergent cause of chest pain, other urgent/non-acute considerations include, but are not limited to: chronic angina, aortic stenosis, cardiomyopathy, myocarditis, mitral valve prolapse, pulmonary hypertension, hypertrophic obstructive cardiomyopathy (HOCM), aortic insufficiency, right ventricular hypertrophy, pneumonia, pleuritis, bronchitis, pneumothorax, tumor, gastroesophageal reflux disease (GERD), esophageal spasm,  Mallory-Weiss syndrome, peptic ulcer disease, biliary disease, pancreatitis, functional gastrointestinal pain, cervical or thoracic disk disease or arthritis, shoulder arthritis, costochondritis, subacromial bursitis, anxiety or panic attack, herpes zoster, breast disorders, chest wall tumors, thoracic outlet syndrome, mediastinitis.   Co morbidities that complicate the patient evaluation   bipolar 2, fibromyalgia, anxiety, depression, factor V Leiden, iron deficiency anemia, thyroid cancer, questionable pulmonary embolism  My initial workup includes ACS rule out, PE study  Additional history obtained from: Nursing notes from this visit.  I ordered, reviewed and interpreted labs which include: BMP,  CBC, troponin, delta troponin, respiratory panel, urine pregnancy.  Hypocalcemia of 8.5.  Labs otherwise unremarkable  I ordered imaging studies including chest x-ray, PE study I independently visualized and interpreted imaging which showed normal, no PE I agree with the radiologist interpretation  Afebrile, hemodynamically stable.  42 year old female presents ED for evaluation of left-sided chest pain.  On exam, she is tender to palpation over the area that she is describing the pain in.  She appears overall very well.  Lab workup unremarkable including troponin and delta troponin.  EKG shows no ischemic changes.  Chest x-ray negative.  CT PE study negative.  Low suspicion for ACS or PE.  Likely costochondritis.  Heart score of 1.  Patient's primary care provider is a cardiologist.  She was encouraged to follow-up in 1 week for reevaluation.  She was given strict return precautions.  Stable at discharge.  At this time there does not appear to be any evidence of an acute emergency medical condition and the patient appears stable for discharge with appropriate outpatient follow up. Diagnosis was discussed with patient who verbalizes understanding of care plan and is agreeable to discharge. I have  discussed return precautions with patient who verbalizes understanding. Patient encouraged to follow-up with their PCP within 1 week. All questions answered.  Patient's case discussed with Dr. Armandina Gemma who agrees with plan to discharge with follow-up.   Note: Portions of this report may have been transcribed using voice recognition software. Every effort was made to ensure accuracy; however, inadvertent computerized transcription errors may still be present.        Final Clinical Impression(s) / ED Diagnoses Final diagnoses:  Atypical chest pain    Rx / DC Orders ED Discharge Orders     None         Nehemiah Massed 09/13/22 2041    Regan Lemming, MD 09/13/22 2350

## 2022-09-13 NOTE — Discharge Instructions (Signed)
You have been seen today for your complaint of chest pain. Your lab work was overall very reassuring. Your imaging was reassuring and showed no abnormalities. Your discharge medications include Alternate tylenol and ibuprofen for pain. You may alternate these every 4 hours. You may take up to 800 mg of ibuprofen at a time and up to 1000 mg of tylenol. Follow up with: Your primary care provider in 1 week for reevaluation Please seek immediate medical care if you develop any of the following symptoms: Your chest pain gets worse. You have a cough that gets worse, or you cough up blood. You have severe pain in your abdomen. You faint. You have sudden, unexplained chest discomfort. You have sudden, unexplained discomfort in your arms, back, neck, or jaw. You have shortness of breath at any time. You suddenly start to sweat, or your skin gets clammy. You feel nausea or you vomit. You suddenly feel lightheaded or dizzy. You have severe weakness, or unexplained weakness or fatigue. Your heart begins to beat quickly, or it feels like it is skipping beats. At this time there does not appear to be the presence of an emergent medical condition, however there is always the potential for conditions to change. Please read and follow the below instructions.  Do not take your medicine if  develop an itchy rash, swelling in your mouth or lips, or difficulty breathing; call 911 and seek immediate emergency medical attention if this occurs.  You may review your lab tests and imaging results in their entirety on your MyChart account.  Please discuss all results of fully with your primary care provider and other specialist at your follow-up visit.  Note: Portions of this text may have been transcribed using voice recognition software. Every effort was made to ensure accuracy; however, inadvertent computerized transcription errors may still be present.

## 2022-09-13 NOTE — ED Triage Notes (Signed)
C/o chest pain intermittently since yesterday. Also having cold sweats and tingling down right arm with shortness of breath. Pain in mid/left chest radiating to back.

## 2022-09-14 DIAGNOSIS — R928 Other abnormal and inconclusive findings on diagnostic imaging of breast: Secondary | ICD-10-CM | POA: Diagnosis not present

## 2022-09-14 DIAGNOSIS — Z9189 Other specified personal risk factors, not elsewhere classified: Secondary | ICD-10-CM | POA: Diagnosis not present

## 2022-09-14 DIAGNOSIS — Z809 Family history of malignant neoplasm, unspecified: Secondary | ICD-10-CM | POA: Diagnosis not present

## 2022-09-27 DIAGNOSIS — Z1231 Encounter for screening mammogram for malignant neoplasm of breast: Secondary | ICD-10-CM | POA: Diagnosis not present

## 2022-09-27 DIAGNOSIS — Z809 Family history of malignant neoplasm, unspecified: Secondary | ICD-10-CM | POA: Diagnosis not present

## 2022-09-28 DIAGNOSIS — R072 Precordial pain: Secondary | ICD-10-CM | POA: Diagnosis not present

## 2022-09-28 DIAGNOSIS — R0602 Shortness of breath: Secondary | ICD-10-CM | POA: Diagnosis not present

## 2022-09-28 DIAGNOSIS — E038 Other specified hypothyroidism: Secondary | ICD-10-CM | POA: Diagnosis not present

## 2022-09-28 DIAGNOSIS — N3020 Other chronic cystitis without hematuria: Secondary | ICD-10-CM | POA: Diagnosis not present

## 2022-09-29 DIAGNOSIS — M719 Bursopathy, unspecified: Secondary | ICD-10-CM | POA: Diagnosis not present

## 2022-10-27 DIAGNOSIS — D508 Other iron deficiency anemias: Secondary | ICD-10-CM | POA: Diagnosis not present

## 2022-11-03 DIAGNOSIS — E538 Deficiency of other specified B group vitamins: Secondary | ICD-10-CM | POA: Diagnosis not present

## 2022-11-03 DIAGNOSIS — R635 Abnormal weight gain: Secondary | ICD-10-CM | POA: Diagnosis not present

## 2022-11-03 DIAGNOSIS — E559 Vitamin D deficiency, unspecified: Secondary | ICD-10-CM | POA: Diagnosis not present

## 2022-11-03 DIAGNOSIS — E039 Hypothyroidism, unspecified: Secondary | ICD-10-CM | POA: Diagnosis not present

## 2022-11-25 DIAGNOSIS — J029 Acute pharyngitis, unspecified: Secondary | ICD-10-CM | POA: Diagnosis not present

## 2022-11-25 DIAGNOSIS — R509 Fever, unspecified: Secondary | ICD-10-CM | POA: Diagnosis not present

## 2022-11-25 DIAGNOSIS — R051 Acute cough: Secondary | ICD-10-CM | POA: Diagnosis not present

## 2022-11-25 DIAGNOSIS — R0981 Nasal congestion: Secondary | ICD-10-CM | POA: Diagnosis not present

## 2023-01-07 DIAGNOSIS — Z79899 Other long term (current) drug therapy: Secondary | ICD-10-CM | POA: Diagnosis not present

## 2023-01-07 DIAGNOSIS — M2550 Pain in unspecified joint: Secondary | ICD-10-CM | POA: Diagnosis not present

## 2023-01-24 DIAGNOSIS — N3020 Other chronic cystitis without hematuria: Secondary | ICD-10-CM | POA: Diagnosis not present

## 2023-01-24 DIAGNOSIS — E038 Other specified hypothyroidism: Secondary | ICD-10-CM | POA: Diagnosis not present

## 2023-01-24 DIAGNOSIS — R072 Precordial pain: Secondary | ICD-10-CM | POA: Diagnosis not present

## 2023-01-24 DIAGNOSIS — R0602 Shortness of breath: Secondary | ICD-10-CM | POA: Diagnosis not present

## 2023-04-22 DIAGNOSIS — M7158 Other bursitis, not elsewhere classified, other site: Secondary | ICD-10-CM | POA: Diagnosis not present

## 2023-05-05 DIAGNOSIS — E039 Hypothyroidism, unspecified: Secondary | ICD-10-CM | POA: Diagnosis not present

## 2023-05-05 DIAGNOSIS — E538 Deficiency of other specified B group vitamins: Secondary | ICD-10-CM | POA: Diagnosis not present

## 2023-05-05 DIAGNOSIS — E559 Vitamin D deficiency, unspecified: Secondary | ICD-10-CM | POA: Diagnosis not present

## 2023-05-05 DIAGNOSIS — R635 Abnormal weight gain: Secondary | ICD-10-CM | POA: Diagnosis not present

## 2023-05-09 DIAGNOSIS — D508 Other iron deficiency anemias: Secondary | ICD-10-CM | POA: Diagnosis not present

## 2023-05-09 DIAGNOSIS — E538 Deficiency of other specified B group vitamins: Secondary | ICD-10-CM | POA: Diagnosis not present

## 2023-05-12 DIAGNOSIS — M47814 Spondylosis without myelopathy or radiculopathy, thoracic region: Secondary | ICD-10-CM | POA: Diagnosis not present

## 2023-06-07 DIAGNOSIS — M47814 Spondylosis without myelopathy or radiculopathy, thoracic region: Secondary | ICD-10-CM | POA: Diagnosis not present

## 2023-06-28 DIAGNOSIS — Z83719 Family history of colon polyps, unspecified: Secondary | ICD-10-CM | POA: Diagnosis not present

## 2023-06-28 DIAGNOSIS — D508 Other iron deficiency anemias: Secondary | ICD-10-CM | POA: Diagnosis not present

## 2023-06-28 DIAGNOSIS — K279 Peptic ulcer, site unspecified, unspecified as acute or chronic, without hemorrhage or perforation: Secondary | ICD-10-CM | POA: Diagnosis not present

## 2023-06-28 DIAGNOSIS — Z860100 Personal history of colon polyps, unspecified: Secondary | ICD-10-CM | POA: Diagnosis not present

## 2023-06-28 DIAGNOSIS — K219 Gastro-esophageal reflux disease without esophagitis: Secondary | ICD-10-CM | POA: Diagnosis not present

## 2023-06-28 DIAGNOSIS — Z9889 Other specified postprocedural states: Secondary | ICD-10-CM | POA: Diagnosis not present

## 2023-07-12 DIAGNOSIS — M47814 Spondylosis without myelopathy or radiculopathy, thoracic region: Secondary | ICD-10-CM | POA: Diagnosis not present

## 2023-07-15 DIAGNOSIS — M2550 Pain in unspecified joint: Secondary | ICD-10-CM | POA: Diagnosis not present

## 2023-07-15 DIAGNOSIS — M797 Fibromyalgia: Secondary | ICD-10-CM | POA: Diagnosis not present

## 2023-07-15 DIAGNOSIS — Z79899 Other long term (current) drug therapy: Secondary | ICD-10-CM | POA: Diagnosis not present

## 2023-08-11 DIAGNOSIS — M47814 Spondylosis without myelopathy or radiculopathy, thoracic region: Secondary | ICD-10-CM | POA: Diagnosis not present

## 2023-09-01 DIAGNOSIS — Z8781 Personal history of (healed) traumatic fracture: Secondary | ICD-10-CM | POA: Diagnosis not present

## 2023-09-01 DIAGNOSIS — M47814 Spondylosis without myelopathy or radiculopathy, thoracic region: Secondary | ICD-10-CM | POA: Diagnosis not present

## 2023-09-01 DIAGNOSIS — G894 Chronic pain syndrome: Secondary | ICD-10-CM | POA: Diagnosis not present

## 2023-09-01 DIAGNOSIS — M7918 Myalgia, other site: Secondary | ICD-10-CM | POA: Diagnosis not present

## 2023-10-04 DIAGNOSIS — R300 Dysuria: Secondary | ICD-10-CM | POA: Diagnosis not present

## 2023-10-04 DIAGNOSIS — R000 Tachycardia, unspecified: Secondary | ICD-10-CM | POA: Diagnosis not present

## 2023-10-04 DIAGNOSIS — N3000 Acute cystitis without hematuria: Secondary | ICD-10-CM | POA: Diagnosis not present

## 2023-10-04 DIAGNOSIS — R519 Headache, unspecified: Secondary | ICD-10-CM | POA: Diagnosis not present

## 2023-10-04 DIAGNOSIS — D6959 Other secondary thrombocytopenia: Secondary | ICD-10-CM | POA: Diagnosis not present

## 2023-10-04 DIAGNOSIS — R233 Spontaneous ecchymoses: Secondary | ICD-10-CM | POA: Diagnosis not present

## 2023-10-04 DIAGNOSIS — R5383 Other fatigue: Secondary | ICD-10-CM | POA: Diagnosis not present

## 2023-11-03 DIAGNOSIS — E039 Hypothyroidism, unspecified: Secondary | ICD-10-CM | POA: Diagnosis not present

## 2023-11-03 DIAGNOSIS — E538 Deficiency of other specified B group vitamins: Secondary | ICD-10-CM | POA: Diagnosis not present

## 2023-11-03 DIAGNOSIS — E559 Vitamin D deficiency, unspecified: Secondary | ICD-10-CM | POA: Diagnosis not present

## 2023-11-03 DIAGNOSIS — E663 Overweight: Secondary | ICD-10-CM | POA: Diagnosis not present

## 2023-11-07 DIAGNOSIS — E538 Deficiency of other specified B group vitamins: Secondary | ICD-10-CM | POA: Diagnosis not present

## 2023-11-07 DIAGNOSIS — D508 Other iron deficiency anemias: Secondary | ICD-10-CM | POA: Diagnosis not present

## 2023-11-29 DIAGNOSIS — Z1231 Encounter for screening mammogram for malignant neoplasm of breast: Secondary | ICD-10-CM | POA: Diagnosis not present

## 2024-01-10 DIAGNOSIS — M542 Cervicalgia: Secondary | ICD-10-CM | POA: Diagnosis not present

## 2024-01-10 DIAGNOSIS — E559 Vitamin D deficiency, unspecified: Secondary | ICD-10-CM | POA: Diagnosis not present

## 2024-01-10 DIAGNOSIS — E538 Deficiency of other specified B group vitamins: Secondary | ICD-10-CM | POA: Diagnosis not present

## 2024-01-10 DIAGNOSIS — E039 Hypothyroidism, unspecified: Secondary | ICD-10-CM | POA: Diagnosis not present

## 2024-01-20 DIAGNOSIS — E042 Nontoxic multinodular goiter: Secondary | ICD-10-CM | POA: Diagnosis not present

## 2024-01-20 DIAGNOSIS — M542 Cervicalgia: Secondary | ICD-10-CM | POA: Diagnosis not present

## 2024-01-20 DIAGNOSIS — R590 Localized enlarged lymph nodes: Secondary | ICD-10-CM | POA: Diagnosis not present

## 2024-01-29 DIAGNOSIS — R300 Dysuria: Secondary | ICD-10-CM | POA: Diagnosis not present

## 2024-01-29 DIAGNOSIS — N3010 Interstitial cystitis (chronic) without hematuria: Secondary | ICD-10-CM | POA: Diagnosis not present

## 2024-01-29 DIAGNOSIS — N390 Urinary tract infection, site not specified: Secondary | ICD-10-CM | POA: Diagnosis not present

## 2024-02-21 DIAGNOSIS — M2550 Pain in unspecified joint: Secondary | ICD-10-CM | POA: Diagnosis not present

## 2024-02-21 DIAGNOSIS — Z79899 Other long term (current) drug therapy: Secondary | ICD-10-CM | POA: Diagnosis not present

## 2024-02-29 DIAGNOSIS — M7918 Myalgia, other site: Secondary | ICD-10-CM | POA: Diagnosis not present

## 2024-02-29 DIAGNOSIS — Z8781 Personal history of (healed) traumatic fracture: Secondary | ICD-10-CM | POA: Diagnosis not present

## 2024-02-29 DIAGNOSIS — G894 Chronic pain syndrome: Secondary | ICD-10-CM | POA: Diagnosis not present

## 2024-03-20 DIAGNOSIS — N3010 Interstitial cystitis (chronic) without hematuria: Secondary | ICD-10-CM | POA: Diagnosis not present

## 2024-03-20 DIAGNOSIS — R3915 Urgency of urination: Secondary | ICD-10-CM | POA: Diagnosis not present

## 2024-03-20 DIAGNOSIS — R350 Frequency of micturition: Secondary | ICD-10-CM | POA: Diagnosis not present

## 2024-04-30 DIAGNOSIS — M7918 Myalgia, other site: Secondary | ICD-10-CM | POA: Diagnosis not present

## 2024-04-30 DIAGNOSIS — G8929 Other chronic pain: Secondary | ICD-10-CM | POA: Diagnosis not present

## 2024-05-06 DIAGNOSIS — T171XXA Foreign body in nostril, initial encounter: Secondary | ICD-10-CM | POA: Diagnosis not present

## 2024-05-07 DIAGNOSIS — D508 Other iron deficiency anemias: Secondary | ICD-10-CM | POA: Diagnosis not present

## 2024-05-07 DIAGNOSIS — E538 Deficiency of other specified B group vitamins: Secondary | ICD-10-CM | POA: Diagnosis not present

## 2024-06-06 DIAGNOSIS — Z800 Family history of malignant neoplasm of digestive organs: Secondary | ICD-10-CM | POA: Diagnosis not present

## 2024-06-06 DIAGNOSIS — K920 Hematemesis: Secondary | ICD-10-CM | POA: Diagnosis not present

## 2024-06-06 DIAGNOSIS — Z8719 Personal history of other diseases of the digestive system: Secondary | ICD-10-CM | POA: Diagnosis not present

## 2024-06-06 DIAGNOSIS — R1013 Epigastric pain: Secondary | ICD-10-CM | POA: Diagnosis not present

## 2024-06-06 DIAGNOSIS — K921 Melena: Secondary | ICD-10-CM | POA: Diagnosis not present

## 2024-06-06 DIAGNOSIS — R112 Nausea with vomiting, unspecified: Secondary | ICD-10-CM | POA: Diagnosis not present

## 2024-07-10 DIAGNOSIS — R1013 Epigastric pain: Secondary | ICD-10-CM | POA: Diagnosis not present

## 2024-07-10 DIAGNOSIS — Z800 Family history of malignant neoplasm of digestive organs: Secondary | ICD-10-CM | POA: Diagnosis not present

## 2024-07-10 DIAGNOSIS — K648 Other hemorrhoids: Secondary | ICD-10-CM | POA: Diagnosis not present

## 2024-07-10 DIAGNOSIS — K920 Hematemesis: Secondary | ICD-10-CM | POA: Diagnosis not present

## 2024-07-10 DIAGNOSIS — Z9884 Bariatric surgery status: Secondary | ICD-10-CM | POA: Diagnosis not present

## 2024-07-10 DIAGNOSIS — K921 Melena: Secondary | ICD-10-CM | POA: Diagnosis not present

## 2024-07-12 DIAGNOSIS — G894 Chronic pain syndrome: Secondary | ICD-10-CM | POA: Diagnosis not present

## 2024-07-12 DIAGNOSIS — M5481 Occipital neuralgia: Secondary | ICD-10-CM | POA: Diagnosis not present

## 2024-07-12 DIAGNOSIS — M797 Fibromyalgia: Secondary | ICD-10-CM | POA: Diagnosis not present
